# Patient Record
Sex: Female | Born: 1985 | Race: White | Hispanic: No | Marital: Married | Smoking: Former smoker
Health system: Southern US, Community
[De-identification: ages and names within clinical notes are randomized; demographics above are authoritative.]

## PROBLEM LIST (undated history)

## (undated) DIAGNOSIS — R87619 Unspecified abnormal cytological findings in specimens from cervix uteri: Secondary | ICD-10-CM

## (undated) DIAGNOSIS — T7840XA Allergy, unspecified, initial encounter: Secondary | ICD-10-CM

## (undated) DIAGNOSIS — L309 Dermatitis, unspecified: Secondary | ICD-10-CM

## (undated) HISTORY — DX: Allergy, unspecified, initial encounter: T78.40XA

## (undated) HISTORY — PX: TYMPANOSTOMY TUBE PLACEMENT: SHX32

## (undated) HISTORY — PX: MOUTH SURGERY: SHX715

## (undated) HISTORY — DX: Dermatitis, unspecified: L30.9

## (undated) HISTORY — DX: Unspecified abnormal cytological findings in specimens from cervix uteri: R87.619

## (undated) HISTORY — PX: COLPOSCOPY: SHX161

---
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
## 2010-12-24 ENCOUNTER — Other Ambulatory Visit (HOSPITAL_COMMUNITY)
Admission: RE | Admit: 2010-12-24 | Discharge: 2010-12-24 | Disposition: A | Discharge status: Home / Self Care | Payer: BC Managed Care – PPO | Source: Ambulatory Visit | Attending: Obstetrics and Gynecology | Admitting: Obstetrics and Gynecology

## 2010-12-24 DIAGNOSIS — Z124 Encounter for screening for malignant neoplasm of cervix: Secondary | ICD-10-CM | POA: Insufficient documentation

## 2014-01-23 ENCOUNTER — Encounter: Payer: Self-pay | Admitting: Obstetrics & Gynecology

## 2014-01-23 ENCOUNTER — Ambulatory Visit (INDEPENDENT_AMBULATORY_CARE_PROVIDER_SITE_OTHER): Payer: BC Managed Care – PPO | Admitting: Obstetrics & Gynecology

## 2014-01-23 VITALS — BP 122/79 | HR 89 | Resp 16 | Ht 62.0 in | Wt 119.0 lb

## 2014-01-23 DIAGNOSIS — Z1151 Encounter for screening for human papillomavirus (HPV): Secondary | ICD-10-CM

## 2014-01-23 DIAGNOSIS — Z01419 Encounter for gynecological examination (general) (routine) without abnormal findings: Secondary | ICD-10-CM | POA: Insufficient documentation

## 2014-01-23 DIAGNOSIS — Z3002 Counseling and instruction in natural family planning to avoid pregnancy: Secondary | ICD-10-CM | POA: Insufficient documentation

## 2014-01-23 DIAGNOSIS — Z124 Encounter for screening for malignant neoplasm of cervix: Secondary | ICD-10-CM | POA: Diagnosis not present

## 2014-01-23 NOTE — Progress Notes (Signed)
Patient ID: Elizabeth Briggs, female   DOB: [birth year], 29 y.o.   MRN: [id number]  Chief Complaint  Patient presents with  . Gynecologic Exam    HPI Elizabeth FalconGrace E Waldvogel is a 28 y.o. female.  G0P0000 Patient's last menstrual period was 01/13/2014 (exact date). Uses natural family planning, menses usually regular and normal flow. Had abnl pap and Bx afeww years ago and last pap was normal.   HPI  Past Medical History  Diagnosis Date  . Abnormal Pap smear of cervix 2004    History reviewed. No pertinent past surgical history.  Family History  Problem Relation Age of Onset  . Hyperlipidemia Mother   . Diabetes type I Brother   . Prostate cancer Father [id number]    Social History History  Substance Use Topics  . Smoking status: Never Smoker   . Smokeless tobacco: Never Used  . Alcohol Use: 0.0 oz/week    0 Not specified per week     Comment: Occasional    No Known Allergies  No current outpatient prescriptions on file.   No current facility-administered medications for this visit.    Review of Systems Review of Systems  Constitutional: Negative.   Respiratory: Negative.   Gastrointestinal: Negative.   Genitourinary: Negative.     Blood pressure 122/79, pulse 89, resp. rate 16, height 5\' 2"  (1.575 m), weight 53.978 kg (119 lb), last menstrual period 01/13/2014.  Physical Exam Physical Exam  Constitutional: She is oriented to person, place, and time. She appears well-developed. No distress.  Neck: Normal range of motion. Neck supple.  Cardiovascular: Normal rate and regular rhythm.   Pulmonary/Chest: Effort normal. No respiratory distress.  Breasts: breasts appear normal, no suspicious masses, no skin or nipple changes or axillary nodes.   Abdominal: Soft. There is no tenderness.  Genitourinary: Vagina normal and uterus normal. No vaginal discharge found.  Pap done, no mass or tenderness  Neurological: She is alert and oriented to person, place, and time.  Skin: Skin is warm  and dry.  Psychiatric: She has a normal mood and affect. Her behavior is normal.      Assessment    Well woman exam uses natural BCM     Plan    Pap result, RTC PRN. Counseling re. BCM given        ARNOLD,JAMES 01/23/2014, 9:31 AM

## 2014-01-23 NOTE — Patient Instructions (Signed)
Contraception Choices Contraception (birth control) is the use of any methods or devices to prevent pregnancy. Below are some methods to help avoid pregnancy. HORMONAL METHODS   Contraceptive implant. This is a thin, plastic tube containing progesterone hormone. It does not contain estrogen hormone. Your health care provider inserts the tube in the inner part of the upper arm. The tube can remain in place for up to 3 years. After 3 years, the implant must be removed. The implant prevents the ovaries from releasing an egg (ovulation), thickens the cervical mucus to prevent sperm from entering the uterus, and thins the lining of the inside of the uterus.  Progesterone-only injections. These injections are given every 3 months by your health care provider to prevent pregnancy. This synthetic progesterone hormone stops the ovaries from releasing eggs. It also thickens cervical mucus and changes the uterine lining. This makes it harder for sperm to survive in the uterus.  Birth control pills. These pills contain estrogen and progesterone hormone. They work by preventing the ovaries from releasing eggs (ovulation). They also cause the cervical mucus to thicken, preventing the sperm from entering the uterus. Birth control pills are prescribed by a health care provider.Birth control pills can also be used to treat heavy periods.  Minipill. This type of birth control pill contains only the progesterone hormone. They are taken every day of each month and must be prescribed by your health care provider.  Birth control patch. The patch contains hormones similar to those in birth control pills. It must be changed once a week and is prescribed by a health care provider.  Vaginal ring. The ring contains hormones similar to those in birth control pills. It is left in the vagina for 3 weeks, removed for 1 week, and then a new one is put back in place. The patient must be comfortable inserting and removing the ring  from the vagina.A health care provider's prescription is necessary.  Emergency contraception. Emergency contraceptives prevent pregnancy after unprotected sexual intercourse. This pill can be taken right after sex or up to 5 days after unprotected sex. It is most effective the sooner you take the pills after having sexual intercourse. Most emergency contraceptive pills are available without a prescription. Check with your pharmacist. Do not use emergency contraception as your only form of birth control. BARRIER METHODS   Female condom. This is a thin sheath (latex or rubber) that is worn over the penis during sexual intercourse. It can be used with spermicide to increase effectiveness.  Female condom. This is a soft, loose-fitting sheath that is put into the vagina before sexual intercourse.  Diaphragm. This is a soft, latex, dome-shaped barrier that must be fitted by a health care provider. It is inserted into the vagina, along with a spermicidal jelly. It is inserted before intercourse. The diaphragm should be left in the vagina for 6 to 8 hours after intercourse.  Cervical cap. This is a round, soft, latex or plastic cup that fits over the cervix and must be fitted by a health care provider. The cap can be left in place for up to 48 hours after intercourse.  Sponge. This is a soft, circular piece of polyurethane foam. The sponge has spermicide in it. It is inserted into the vagina after wetting it and before sexual intercourse.  Spermicides. These are chemicals that kill or block sperm from entering the cervix and uterus. They come in the form of creams, jellies, suppositories, foam, or tablets. They do not require a   prescription. They are inserted into the vagina with an applicator before having sexual intercourse. The process must be repeated every time you have sexual intercourse. INTRAUTERINE CONTRACEPTION  Intrauterine device (IUD). This is a T-shaped device that is put in a woman's uterus  during a menstrual period to prevent pregnancy. There are 2 types:  Copper IUD. This type of IUD is wrapped in copper wire and is placed inside the uterus. Copper makes the uterus and fallopian tubes produce a fluid that kills sperm. It can stay in place for 10 years.  Hormone IUD. This type of IUD contains the hormone progestin (synthetic progesterone). The hormone thickens the cervical mucus and prevents sperm from entering the uterus, and it also thins the uterine lining to prevent implantation of a fertilized egg. The hormone can weaken or kill the sperm that get into the uterus. It can stay in place for 3-5 years, depending on which type of IUD is used. PERMANENT METHODS OF CONTRACEPTION  Female tubal ligation. This is when the woman's fallopian tubes are surgically sealed, tied, or blocked to prevent the egg from traveling to the uterus.  Hysteroscopic sterilization. This involves placing a small coil or insert into each fallopian tube. Your doctor uses a technique called hysteroscopy to do the procedure. The device causes scar tissue to form. This results in permanent blockage of the fallopian tubes, so the sperm cannot fertilize the egg. It takes about 3 months after the procedure for the tubes to become blocked. You must use another form of birth control for these 3 months.  Female sterilization. This is when the female has the tubes that carry sperm tied off (vasectomy).This blocks sperm from entering the vagina during sexual intercourse. After the procedure, the man can still ejaculate fluid (semen). NATURAL PLANNING METHODS  Natural family planning. This is not having sexual intercourse or using a barrier method (condom, diaphragm, cervical cap) on days the woman could become pregnant.  Calendar method. This is keeping track of the length of each menstrual cycle and identifying when you are fertile.  Ovulation method. This is avoiding sexual intercourse during ovulation.  Symptothermal  method. This is avoiding sexual intercourse during ovulation, using a thermometer and ovulation symptoms.  Post-ovulation method. This is timing sexual intercourse after you have ovulated. Regardless of which type or method of contraception you choose, it is important that you use condoms to protect against the transmission of sexually transmitted infections (STIs). Talk with your health care provider about which form of contraception is most appropriate for you. Document Released: 01/13/2005 Document Revised: 01/18/2013 Document Reviewed: 07/08/2012 Exit[employer] Patient Information 2015 [employer]. This information is not intended to replace advice given to you by your health care provider. Make sure you discuss any questions you have with your health care provider.  

## 2014-01-25 LAB — CYTOLOGY - PAP

## 2014-09-08 ENCOUNTER — Other Ambulatory Visit: Payer: Self-pay | Admitting: Advanced Practice Midwife

## 2014-09-08 ENCOUNTER — Ambulatory Visit (INDEPENDENT_AMBULATORY_CARE_PROVIDER_SITE_OTHER): Payer: BLUE CROSS/BLUE SHIELD | Admitting: Advanced Practice Midwife

## 2014-09-08 ENCOUNTER — Encounter: Payer: Self-pay | Admitting: Advanced Practice Midwife

## 2014-09-08 VITALS — Wt 124.0 lb

## 2014-09-08 DIAGNOSIS — Z36 Encounter for antenatal screening of mother: Secondary | ICD-10-CM | POA: Diagnosis not present

## 2014-09-08 DIAGNOSIS — Z3491 Encounter for supervision of normal pregnancy, unspecified, first trimester: Secondary | ICD-10-CM

## 2014-09-08 DIAGNOSIS — Z3401 Encounter for supervision of normal first pregnancy, first trimester: Secondary | ICD-10-CM | POA: Diagnosis not present

## 2014-09-08 DIAGNOSIS — Z3490 Encounter for supervision of normal pregnancy, unspecified, unspecified trimester: Secondary | ICD-10-CM | POA: Insufficient documentation

## 2014-09-08 NOTE — Progress Notes (Signed)
   Subjective:    Elizabeth Briggs is a G1P0000 [redacted]w[redacted]d being seen today for her first obstetrical visit.  Her obstetrical history is significant for nothing, Low Risk.. Patient does intend to breast feed. Pregnancy history fully reviewed.  Patient reports no complaints.  Lives near [place] but works in [place].  Filed Vitals:   09/08/14 1039  Weight: 124 lb (56.246 kg)    HISTORY: OB History  Gravida Para Term Preterm AB SAB TAB Ectopic Multiple Living     # Outcome Date GA Lbr Len/2nd Weight Sex Delivery Anes PTL Lv  1 Current              Past Medical History  Diagnosis Date  . Abnormal Pap smear of cervix 2004   Past Surgical History  Procedure Laterality Date  . Mouth surgery    . Colposcopy     Family History  Problem Relation Age of Onset  . Hyperlipidemia Mother   . Diabetes type I Brother   . Prostate cancer Father 17  . Endometriosis Mother     Medical, Surgical, Family and Social histories reviewed and are listed above.  Medications and allergies reviewed.   Exam    Uterus:  Fundal Height: 7 cm  Pelvic Exam:    Perineum: No Hemorrhoids, Normal Perineum   Vulva: Bartholin's, Urethra, Skene's normal   Vagina:  normal mucosa, normal discharge   pH:    Cervix: nulliparous appearance   Adnexa: no mass, fullness, tenderness   Bony Pelvis: gynecoid  System: Breast:  normal appearance, no masses or tenderness   Skin: normal coloration and turgor, no rashes    Neurologic: oriented, grossly non-focal   Extremities: normal strength, tone, and muscle mass   HEENT neck supple with midline trachea   Mouth/Teeth mucous membranes moist, pharynx normal without lesions   Neck supple and no masses   Cardiovascular: regular rate and rhythm   Respiratory:  appears well, vitals normal, no respiratory distress, acyanotic, normal RR, ear and throat exam is normal, neck free of mass or lymphadenopathy, chest clear, no wheezing, crepitations, rhonchi,  normal symmetric air entry   Abdomen: soft, non-tender; bowel sounds normal; no masses,  no organomegaly   Urinary: urethral meatus normal      Assessment:    Pregnancy: G1P0000 Patient Active Problem List   Diagnosis Date Noted  . Supervision of normal pregnancy 09/08/2014  . Well woman exam with routine gynecological exam 01/23/2014  . Family planning, natural methods to avoid pregnancy 01/23/2014        Plan:     Initial labs drawn. Prenatal vitamins. Problem list reviewed and updated. Genetic Screening discussed First Screen: declined.  Ultrasound discussed; fetal survey: requested.  Follow up in 5 weeks per [employer] 50% of 30 min visit spent on counseling and coordination of care.   Enrolled in [employer] program. RN informed her of program specifics   [facility] 09/08/2014

## 2014-09-08 NOTE — Progress Notes (Signed)
Pt here for initial prenatal visit - 12/15 normal pap Bedside US shows single IUP [redacted]w[redacted]d 16fhr

## 2014-09-08 NOTE — Patient Instructions (Signed)
First Trimester of Pregnancy The first trimester of pregnancy is from week 1 until the end of week 12 (months 1 through 3). A week after a sperm fertilizes an egg, the egg will implant on the wall of the uterus. This embryo will begin to develop into a baby. Genes from you and your partner are forming the baby. The female genes determine whether the baby is a boy or a girl. At 6-8 weeks, the eyes and face are formed, and the heartbeat can be seen on ultrasound. At the end of 12 weeks, all the baby's organs are formed.  Now that you are pregnant, you will want to do everything you can to have a healthy baby. Two of the most important things are to get good prenatal care and to follow your health care provider's instructions. Prenatal care is all the medical care you receive before the baby's birth. This care will help prevent, find, and treat any problems during the pregnancy and childbirth. BODY CHANGES Your body goes through many changes during pregnancy. The changes vary from woman to woman.   You may gain or lose a couple of pounds at first.  You may feel sick to your stomach (nauseous) and throw up (vomit). If the vomiting is uncontrollable, call your health care provider.  You may tire easily.  You may develop headaches that can be relieved by medicines approved by your health care provider.  You may urinate more often. Painful urination may mean you have a bladder infection.  You may develop heartburn as a result of your pregnancy.  You may develop constipation because certain hormones are causing the muscles that push waste through your intestines to slow down.  You may develop hemorrhoids or swollen, bulging veins (varicose veins).  Your breasts may begin to grow larger and become tender. Your nipples may stick out more, and the tissue that surrounds them (areola) may become darker.  Your gums may bleed and may be sensitive to brushing and flossing.  Dark spots or blotches (chloasma,  mask of pregnancy) may develop on your face. This will likely fade after the baby is born.  Your menstrual periods will stop.  You may have a loss of appetite.  You may develop cravings for certain kinds of food.  You may have changes in your emotions from day to day, such as being excited to be pregnant or being concerned that something may go wrong with the pregnancy and baby.  You may have more vivid and strange dreams.  You may have changes in your hair. These can include thickening of your hair, rapid growth, and changes in texture. Some women also have hair loss during or after pregnancy, or hair that feels dry or thin. Your hair will most likely return to normal after your baby is born. WHAT TO EXPECT AT YOUR PRENATAL VISITS During a routine prenatal visit:  You will be weighed to make sure you and the baby are growing normally.  Your blood pressure will be taken.  Your abdomen will be measured to track your baby's growth.  The fetal heartbeat will be listened to starting around week 10 or 12 of your pregnancy.  Test results from any previous visits will be discussed. Your health care provider may ask you:  How you are feeling.  If you are feeling the baby move.  If you have had any abnormal symptoms, such as leaking fluid, bleeding, severe headaches, or abdominal cramping.  If you have any questions. Other tests   that may be performed during your first trimester include:  Blood tests to find your blood type and to check for the presence of any previous infections. They will also be used to check for low iron levels (anemia) and Rh antibodies. Later in the pregnancy, blood tests for diabetes will be done along with other tests if problems develop.  Urine tests to check for infections, diabetes, or protein in the urine.  An ultrasound to confirm the proper growth and development of the baby.  An amniocentesis to check for possible genetic problems.  Fetal screens for  spina bifida and Down syndrome.  You may need other tests to make sure you and the baby are doing well. HOME CARE INSTRUCTIONS  Medicines  Follow your health care provider's instructions regarding medicine use. Specific medicines may be either safe or unsafe to take during pregnancy.  Take your prenatal vitamins as directed.  If you develop constipation, try taking a stool softener if your health care provider approves. Diet  Eat regular, well-balanced meals. Choose a variety of foods, such as meat or vegetable-based protein, fish, milk and low-fat dairy products, vegetables, fruits, and whole grain breads and cereals. Your health care provider will help you determine the amount of weight gain that is right for you.  Avoid raw meat and uncooked cheese. These carry germs that can cause birth defects in the baby.  Eating four or five small meals rather than three large meals a day may help relieve nausea and vomiting. If you start to feel nauseous, eating a few soda crackers can be helpful. Drinking liquids between meals instead of during meals also seems to help nausea and vomiting.  If you develop constipation, eat more high-fiber foods, such as fresh vegetables or fruit and whole grains. Drink enough fluids to keep your urine clear or pale yellow. Activity and Exercise  Exercise only as directed by your health care provider. Exercising will help you:  Control your weight.  Stay in shape.  Be prepared for labor and delivery.  Experiencing pain or cramping in the lower abdomen or low back is a good sign that you should stop exercising. Check with your health care provider before continuing normal exercises.  Try to avoid standing for long periods of time. Move your legs often if you must stand in one place for a long time.  Avoid heavy lifting.  Wear low-heeled shoes, and practice good posture.  You may continue to have sex unless your health care provider directs you  otherwise. Relief of Pain or Discomfort  Wear a good support bra for breast tenderness.   Take warm sitz baths to soothe any pain or discomfort caused by hemorrhoids. Use hemorrhoid cream if your health care provider approves.   Rest with your legs elevated if you have leg cramps or low back pain.  If you develop varicose veins in your legs, wear support hose. Elevate your feet for 15 minutes, 3-4 times a day. Limit salt in your diet. Prenatal Care  Schedule your prenatal visits by the twelfth week of pregnancy. They are usually scheduled monthly at first, then more often in the last 2 months before delivery.  Write down your questions. Take them to your prenatal visits.  Keep all your prenatal visits as directed by your health care provider. Safety  Wear your seat belt at all times when driving.  Make a list of emergency phone numbers, including numbers for family, friends, the hospital, and police and fire departments. General Tips    Ask your health care provider for a referral to a local prenatal education class. Begin classes no later than at the beginning of month 6 of your pregnancy.  Ask for help if you have counseling or nutritional needs during pregnancy. Your health care provider can offer advice or refer you to specialists for help with various needs.  Do not use hot tubs, steam rooms, or saunas.  Do not douche or use tampons or scented sanitary pads.  Do not cross your legs for long periods of time.  Avoid cat litter boxes and soil used by cats. These carry germs that can cause birth defects in the baby and possibly loss of the fetus by miscarriage or stillbirth.  Avoid all smoking, herbs, alcohol, and medicines not prescribed by your health care provider. Chemicals in these affect the formation and growth of the baby.  Schedule a dentist appointment. At home, brush your teeth with a soft toothbrush and be gentle when you floss. SEEK MEDICAL CARE IF:   You have  dizziness.  You have mild pelvic cramps, pelvic pressure, or nagging pain in the abdominal area.  You have persistent nausea, vomiting, or diarrhea.  You have a bad smelling vaginal discharge.  You have pain with urination.  You notice increased swelling in your face, hands, legs, or ankles. SEEK IMMEDIATE MEDICAL CARE IF:   You have a fever.  You are leaking fluid from your vagina.  You have spotting or bleeding from your vagina.  You have severe abdominal cramping or pain.  You have rapid weight gain or loss.  You vomit blood or material that looks like coffee grounds.  You are exposed to German measles and have never had them.  You are exposed to fifth disease or chickenpox.  You develop a severe headache.  You have shortness of breath.  You have any kind of trauma, such as from a fall or a car accident. Document Released: 01/07/2001 Document Revised: 05/30/2013 Document Reviewed: 11/23/2012 Exit[employer] Patient Information 2015 [employer]. This information is not intended to replace advice given to you by your health care provider. Make sure you discuss any questions you have with your health care provider.  

## 2014-09-09 LAB — GC/CHLAMYDIA PROBE AMP
CT PROBE, AMP APTIMA: NEGATIVE
GC PROBE AMP APTIMA: NEGATIVE

## 2014-09-11 LAB — PRENATAL PROFILE (SOLSTAS)
Antibody Screen: NEGATIVE
BASOS ABS: 0.1 10*3/uL (ref 0.0–0.1)
Basophils Relative: 1 % (ref 0–1)
EOS ABS: 0.1 10*3/uL (ref 0.0–0.7)
EOS PCT: 1 % (ref 0–5)
HCT: 38.5 % (ref 36.0–46.0)
HEP B S AG: NEGATIVE
HIV 1&2 Ab, 4th Generation: NONREACTIVE
Hemoglobin: 12.9 g/dL (ref 12.0–15.0)
Lymphocytes Relative: 23 % (ref 12–46)
Lymphs Abs: 1.4 10*3/uL (ref 0.7–4.0)
MCH: 29.3 pg (ref 26.0–34.0)
MCHC: 33.5 g/dL (ref 30.0–36.0)
MCV: 87.5 fL (ref 78.0–100.0)
MPV: 9.6 fL (ref 8.6–12.4)
Monocytes Absolute: 0.5 10*3/uL (ref 0.1–1.0)
Monocytes Relative: 8 % (ref 3–12)
NEUTROS PCT: 67 % (ref 43–77)
Neutro Abs: 4.2 10*3/uL (ref 1.7–7.7)
PLATELETS: 234 10*3/uL (ref 150–400)
RBC: 4.4 MIL/uL (ref 3.87–5.11)
RDW: 13.7 % (ref 11.5–15.5)
Rh Type: POSITIVE
Rubella: 0.67 Index (ref <0.90)
WBC: 6.3 10*3/uL (ref 4.0–10.5)

## 2014-09-12 ENCOUNTER — Telehealth: Payer: Self-pay | Admitting: *Deleted

## 2014-09-12 DIAGNOSIS — N390 Urinary tract infection, site not specified: Secondary | ICD-10-CM

## 2014-09-12 LAB — CULTURE, OB URINE: Colony Count: >=100000

## 2014-09-12 MED ORDER — CEPHALEXIN 500 MG PO CAPS: 500.0000 mg | ORAL_CAPSULE | Freq: Four times a day (QID) | ORAL | Status: DC

## 2014-09-12 NOTE — Telephone Encounter (Signed)
Pt notified of urine culture and needs ABX.  Per Dr Penne Lash Keflex 500 mg QID x 7 days sent to [facility] in [place].

## 2014-09-14 ENCOUNTER — Telehealth: Payer: Self-pay | Admitting: *Deleted

## 2014-09-14 DIAGNOSIS — Z3491 Encounter for supervision of normal pregnancy, unspecified, first trimester: Secondary | ICD-10-CM

## 2014-09-14 NOTE — Telephone Encounter (Signed)
Updated problem list

## 2014-10-09 ENCOUNTER — Ambulatory Visit (INDEPENDENT_AMBULATORY_CARE_PROVIDER_SITE_OTHER): Payer: BLUE CROSS/BLUE SHIELD | Admitting: Advanced Practice Midwife

## 2014-10-09 VITALS — BP 113/76 | HR 99 | Wt 124.0 lb

## 2014-10-09 DIAGNOSIS — Z3481 Encounter for supervision of other normal pregnancy, first trimester: Secondary | ICD-10-CM

## 2014-10-09 DIAGNOSIS — Z3491 Encounter for supervision of normal pregnancy, unspecified, first trimester: Secondary | ICD-10-CM

## 2014-10-09 NOTE — Addendum Note (Signed)
Addended by: Sharen Counter A on: 10/09/2014 09:01 AM   Modules accepted: Orders

## 2014-10-09 NOTE — Patient Instructions (Signed)
Second Trimester of Pregnancy The second trimester is from week 13 through week 28, months 4 through 6. The second trimester is often a time when you feel your best. Your body has also adjusted to being pregnant, and you begin to feel better physically. Usually, morning sickness has lessened or quit completely, you may have more energy, and you may have an increase in appetite. The second trimester is also a time when the fetus is growing rapidly. At the end of the sixth month, the fetus is about 9 inches long and weighs about 1 pounds. You will likely begin to feel the baby move (quickening) between 18 and 20 weeks of the pregnancy. BODY CHANGES Your body goes through many changes during pregnancy. The changes vary from woman to woman.   Your weight will continue to increase. You will notice your lower abdomen bulging out.  You may begin to get stretch marks on your hips, abdomen, and breasts.  You may develop headaches that can be relieved by medicines approved by your health care provider.  You may urinate more often because the fetus is pressing on your bladder.  You may develop or continue to have heartburn as a result of your pregnancy.  You may develop constipation because certain hormones are causing the muscles that push waste through your intestines to slow down.  You may develop hemorrhoids or swollen, bulging veins (varicose veins).  You may have back pain because of the weight gain and pregnancy hormones relaxing your joints between the bones in your pelvis and as a result of a shift in weight and the muscles that support your balance.  Your breasts will continue to grow and be tender.  Your gums may bleed and may be sensitive to brushing and flossing.  Dark spots or blotches (chloasma, mask of pregnancy) may develop on your face. This will likely fade after the baby is born.  A dark line from your belly button to the pubic area (linea nigra) may appear. This will likely fade  after the baby is born.  You may have changes in your hair. These can include thickening of your hair, rapid growth, and changes in texture. Some women also have hair loss during or after pregnancy, or hair that feels dry or thin. Your hair will most likely return to normal after your baby is born. WHAT TO EXPECT AT YOUR PRENATAL VISITS During a routine prenatal visit:  You will be weighed to make sure you and the fetus are growing normally.  Your blood pressure will be taken.  Your abdomen will be measured to track your baby's growth.  The fetal heartbeat will be listened to.  Any test results from the previous visit will be discussed. Your health care provider may ask you:  How you are feeling.  If you are feeling the baby move.  If you have had any abnormal symptoms, such as leaking fluid, bleeding, severe headaches, or abdominal cramping.  If you have any questions. Other tests that may be performed during your second trimester include:  Blood tests that check for:  Low iron levels (anemia).  Gestational diabetes (between 24 and 28 weeks).  Rh antibodies.  Urine tests to check for infections, diabetes, or protein in the urine.  An ultrasound to confirm the proper growth and development of the baby.  An amniocentesis to check for possible genetic problems.  Fetal screens for spina bifida and Down syndrome. HOME CARE INSTRUCTIONS   Avoid all smoking, herbs, alcohol, and unprescribed   drugs. These chemicals affect the formation and growth of the baby.  Follow your health care provider's instructions regarding medicine use. There are medicines that are either safe or unsafe to take during pregnancy.  Exercise only as directed by your health care provider. Experiencing uterine cramps is a good sign to stop exercising.  Continue to eat regular, healthy meals.  Wear a good support bra for breast tenderness.  Do not use hot tubs, steam rooms, or saunas.  Wear your  seat belt at all times when driving.  Avoid raw meat, uncooked cheese, cat litter boxes, and soil used by cats. These carry germs that can cause birth defects in the baby.  Take your prenatal vitamins.  Try taking a stool softener (if your health care provider approves) if you develop constipation. Eat more high-fiber foods, such as fresh vegetables or fruit and whole grains. Drink plenty of fluids to keep your urine clear or pale yellow.  Take warm sitz baths to soothe any pain or discomfort caused by hemorrhoids. Use hemorrhoid cream if your health care provider approves.  If you develop varicose veins, wear support hose. Elevate your feet for 15 minutes, 3-4 times a day. Limit salt in your diet.  Avoid heavy lifting, wear low heel shoes, and practice good posture.  Rest with your legs elevated if you have leg cramps or low back pain.  Visit your dentist if you have not gone yet during your pregnancy. Use a soft toothbrush to brush your teeth and be gentle when you floss.  A sexual relationship may be continued unless your health care provider directs you otherwise.  Continue to go to all your prenatal visits as directed by your health care provider. SEEK MEDICAL CARE IF:   You have dizziness.  You have mild pelvic cramps, pelvic pressure, or nagging pain in the abdominal area.  You have persistent nausea, vomiting, or diarrhea.  You have a bad smelling vaginal discharge.  You have pain with urination. SEEK IMMEDIATE MEDICAL CARE IF:   You have a fever.  You are leaking fluid from your vagina.  You have spotting or bleeding from your vagina.  You have severe abdominal cramping or pain.  You have rapid weight gain or loss.  You have shortness of breath with chest pain.  You notice sudden or extreme swelling of your face, hands, ankles, feet, or legs.  You have not felt your baby move in over an hour.  You have severe headaches that do not go away with  medicine.  You have vision changes. Document Released: 01/07/2001 Document Revised: 01/18/2013 Document Reviewed: 03/16/2012 [employer] Patient Information 2015 [employer]. This information is not intended to replace advice given to you by your health care provider. Make sure you discuss any questions you have with your health care provider.  

## 2014-10-09 NOTE — Progress Notes (Signed)
Pt taking 1/2 Unisom and B6 for nausea

## 2014-10-09 NOTE — Progress Notes (Signed)
Subjective:  Elizabeth Briggs is a 29 y.o. G1P0000 at [redacted]w[redacted]d being seen today for ongoing prenatal care.  Patient reports fatigue and nausea.  Well controlled with Unisom/B6.  Contractions: Not present.  Vag. Bleeding: None. Movement: Absent. Denies leaking of fluid.   The following portions of the patient's history were reviewed and updated as appropriate: allergies, current medications, past family history, past medical history, past social history, past surgical history and problem list.   Objective:   Filed Vitals:   10/09/14 0828  BP: 113/76  Pulse: 99  Weight: 124 lb (56.246 kg)    Fetal Status: Fetal Heart Rate (bpm): 162   Movement: Absent     General:  Alert, oriented and cooperative. Patient is in no acute distress.  Skin: Skin is warm and dry. No rash noted.   Cardiovascular: Normal heart rate noted  Respiratory: Normal respiratory effort, no problems with respiration noted  Abdomen: Soft, gravid, appropriate for gestational age. Pain/Pressure: Absent     Pelvic: Vag. Bleeding: None Vag D/C Character: Thin   Cervical exam deferred        Extremities: Normal range of motion.  Edema: None  Mental Status: Normal mood and affect. Normal behavior. Normal judgment and thought content.   Urinalysis: Urine Protein: Negative Urine Glucose: Negative  Assessment and Plan:  Pregnancy: G1P0000 at [redacted]w[redacted]d  There are no diagnoses linked to this encounter. Miscarriage symptoms and general obstetric precautions including but not limited to vaginal bleeding and cramping were reviewed in detail with the patient. Please refer to After Visit Summary for other counseling recommendations.   F/U per Babyscripts schedule   Hurshel Party, CNM

## 2014-10-16 ENCOUNTER — Encounter: Payer: Self-pay | Admitting: *Deleted

## 2014-11-20 ENCOUNTER — Other Ambulatory Visit: Payer: Self-pay | Admitting: Advanced Practice Midwife

## 2014-11-20 ENCOUNTER — Ambulatory Visit (HOSPITAL_COMMUNITY)
Admission: RE | Admit: 2014-11-20 | Discharge: 2014-11-20 | Disposition: A | Discharge status: Home / Self Care | Payer: BLUE CROSS/BLUE SHIELD | Source: Ambulatory Visit | Attending: Advanced Practice Midwife | Admitting: Advanced Practice Midwife

## 2014-11-20 DIAGNOSIS — Z3491 Encounter for supervision of normal pregnancy, unspecified, first trimester: Secondary | ICD-10-CM

## 2014-12-08 ENCOUNTER — Encounter: Payer: BLUE CROSS/BLUE SHIELD | Admitting: Certified Nurse Midwife

## 2014-12-13 ENCOUNTER — Ambulatory Visit (INDEPENDENT_AMBULATORY_CARE_PROVIDER_SITE_OTHER): Payer: BLUE CROSS/BLUE SHIELD | Admitting: Obstetrics & Gynecology

## 2014-12-13 VITALS — BP 111/67 | HR 96 | Wt 136.0 lb

## 2014-12-13 DIAGNOSIS — O09892 Supervision of other high risk pregnancies, second trimester: Secondary | ICD-10-CM

## 2014-12-13 DIAGNOSIS — Z23 Encounter for immunization: Secondary | ICD-10-CM | POA: Diagnosis not present

## 2014-12-13 DIAGNOSIS — O4412 Placenta previa with hemorrhage, second trimester: Secondary | ICD-10-CM

## 2014-12-13 DIAGNOSIS — O4403 Placenta previa specified as without hemorrhage, third trimester: Secondary | ICD-10-CM

## 2014-12-13 DIAGNOSIS — Z3492 Encounter for supervision of normal pregnancy, unspecified, second trimester: Secondary | ICD-10-CM

## 2014-12-13 NOTE — Progress Notes (Signed)
Subjective:  Elizabeth Briggs is a 29 y.o. G1P0000 at 8442w2d being seen today for ongoing prenatal care.  Patient reports occasional contractions and mild lower back pain, reflux (mild), pain behind knee yesterday.  Contractions: Not present.  Vag. Bleeding: None. Movement: Present. Denies leaking of fluid.   The following portions of the patient's history were reviewed and updated as appropriate: allergies, current medications, past family history, past medical history, past social history, past surgical history and problem list. Problem list updated.  Objective:   Filed Vitals:   12/13/14 1002  BP: 111/67  Pulse: 96  Weight: 136 lb (61.689 kg)    Fetal Status: Fetal Heart Rate (bpm): 157 Fundal Height: 22 cm Movement: Present     General:  Alert, oriented and cooperative. Patient is in no acute distress.  Skin: Skin is warm and dry. No rash noted.   Cardiovascular: Normal heart rate noted  Respiratory: Normal respiratory effort, no problems with respiration noted  Abdomen: Soft, gravid, appropriate for gestational age. Pain/Pressure: Absent     Pelvic: Vag. Bleeding: None Vag D/C Character: Thin   Cervical exam performed        Extremities: Normal range of motion.  Edema: Trace; no swelling, redness, pain, neg Homans  Mental Status: Normal mood and affect. Normal behavior. Normal judgment and thought content.   Urinalysis: Urine Protein: Negative Urine Glucose: Negative  Assessment and Plan:  Pregnancy: G1P0000 at 8242w2d  1. Immunization due - Flu Vaccine QUAD 36+ mos IM (Fluarix, Quad PF)  2. Supervision of normal pregnancy, second trimester -pregnancy belt, yoga, stretches -incrase Zantac to 150 mg bid  3. Placenta previa, third trimester - f/u anatomy; VAG REST - US MFM OB FOLLOW UP; Future  Preterm labor symptoms and general obstetric precautions including but not limited to vaginal bleeding, contractions, leaking of fluid and fetal movement were reviewed in detail with the  patient. Please refer to After Visit Summary for other counseling recommendations.  Return in about 7 weeks (around 01/31/2015).   Lesly DukesKelly H Payeton Germani, MD

## 2014-12-13 NOTE — Progress Notes (Signed)
BABYSCRIPTS PATIENT: [ x] initial, [x ] 6312, [x ] 20, [ ]  28, [ ]  32, [ ]  36, [ ]  38, [ ]  39, [ ]  40 Constipation issues

## 2015-01-05 ENCOUNTER — Ambulatory Visit (HOSPITAL_COMMUNITY)
Admission: RE | Admit: 2015-01-05 | Discharge: 2015-01-05 | Disposition: A | Discharge status: Home / Self Care | Payer: BLUE CROSS/BLUE SHIELD | Source: Ambulatory Visit | Attending: Obstetrics & Gynecology | Admitting: Obstetrics & Gynecology

## 2015-01-05 DIAGNOSIS — Z3492 Encounter for supervision of normal pregnancy, unspecified, second trimester: Secondary | ICD-10-CM

## 2015-01-05 DIAGNOSIS — Z3A24 24 weeks gestation of pregnancy: Secondary | ICD-10-CM | POA: Insufficient documentation

## 2015-01-05 DIAGNOSIS — Z36 Encounter for antenatal screening of mother: Secondary | ICD-10-CM | POA: Insufficient documentation

## 2015-01-05 DIAGNOSIS — O4403 Placenta previa specified as without hemorrhage, third trimester: Secondary | ICD-10-CM

## 2015-01-28 NOTE — L&D Delivery Note (Signed)
Patient is 30 y.o. G1P0000 5353w0d admitted IOL for post dates   Delivery Note At 8:36 PM a viable female was delivered via Vaginal, Spontaneous Delivery (Presentation: Left Occiput Anterior).  APGAR: 6, 9; weight pending.  Head was delivered by myself.  Nuchal cord x1 appreciated.  This was easily reduced.  Shoulders were tight.  Elizabeth Briggs, CNM, immediately took over delivery and delivered the shoulders.  Infant dried and placed on mother's abdomen.  Upon delivery, baby was slow to rouse.  After about 10-15 seconds of stimulation, Cord clamped and was quickly cut by FOB, and infant was transferred to the warmer, where resuscitative measures were continued.  Code APGAR called at this time as well.  NICU attending quickly arrived and took over care of infant, see their note for details.  Cord arterial and venous blood gas collected.  Hospital cord blood sample collected.  Placenta delivered after about 28 minutes, fundal massage applied, and vagina inspected for lacerations.  Bilateral shallow labial lacs appreciated.  These were repaired with a single stitch each.  Placenta status: Intact, Spontaneous.  Cord: 3VC with the following complications: nuchal x1.  Cord pH: pending  Anesthesia: Epidural  Episiotomy: None Lacerations: Labial Suture Repair: 3.0 vicryl rapide Est. Blood Loss (mL): 100cc   Mom to postpartum.  Baby to Couplet care / Skin to Skin.  Elizabeth FlavinAshly Mahonri Seiden, DO 04/30/2015, 9:05 PM

## 2015-01-31 ENCOUNTER — Ambulatory Visit (INDEPENDENT_AMBULATORY_CARE_PROVIDER_SITE_OTHER): Payer: BLUE CROSS/BLUE SHIELD | Admitting: Obstetrics & Gynecology

## 2015-01-31 VITALS — BP 105/72 | HR 101 | Wt 146.0 lb

## 2015-01-31 DIAGNOSIS — Z3490 Encounter for supervision of normal pregnancy, unspecified, unspecified trimester: Secondary | ICD-10-CM

## 2015-01-31 DIAGNOSIS — Z36 Encounter for antenatal screening of mother: Secondary | ICD-10-CM

## 2015-01-31 DIAGNOSIS — Z3403 Encounter for supervision of normal first pregnancy, third trimester: Secondary | ICD-10-CM

## 2015-01-31 DIAGNOSIS — Z23 Encounter for immunization: Secondary | ICD-10-CM

## 2015-01-31 DIAGNOSIS — Z3492 Encounter for supervision of normal pregnancy, unspecified, second trimester: Secondary | ICD-10-CM

## 2015-01-31 LAB — CBC
HEMATOCRIT: 33.6 % — AB (ref 36.0–46.0)
HEMOGLOBIN: 11.6 g/dL — AB (ref 12.0–15.0)
MCH: 29.7 pg (ref 26.0–34.0)
MCHC: 34.5 g/dL (ref 30.0–36.0)
MCV: 86.2 fL (ref 78.0–100.0)
MPV: 10.2 fL (ref 8.6–12.4)
Platelets: 199 10*3/uL (ref 150–400)
RBC: 3.9 MIL/uL (ref 3.87–5.11)
RDW: 13.5 % (ref 11.5–15.5)
WBC: 5.7 10*3/uL (ref 4.0–10.5)

## 2015-01-31 MED ORDER — BREAST PUMP MISC: Status: DC

## 2015-01-31 NOTE — Progress Notes (Signed)
28wk labs today Urine dip shows trace glucose but pt had already had her glucola for 28wk labs before providing urine specimen.

## 2015-01-31 NOTE — Progress Notes (Signed)
Subjective:  Elizabeth Briggs is a 30 y.o. G1P0000 at 3338w2d being seen today for ongoing prenatal care.  She is currently monitored for the following issues for this low-risk pregnancy and has Well woman exam with routine gynecological exam; Family planning, natural methods to avoid pregnancy; and Supervision of normal pregnancy on her problem list.  Patient reports no complaints.  Contractions: Not present. Vag. Bleeding: None.  Movement: Present. Denies leaking of fluid.   The following portions of the patient's history were reviewed and updated as appropriate: allergies, current medications, past family history, past medical history, past social history, past surgical history and problem list. Problem list updated.  Objective:   Filed Vitals:   01/31/15 0956  BP: 105/72  Pulse: 101  Weight: 146 lb (66.225 kg)    Fetal Status: Fetal Heart Rate (bpm): 150 Fundal Height: 29 cm Movement: Present     General:  Alert, oriented and cooperative. Patient is in no acute distress.  Skin: Skin is warm and dry. No rash noted.   Cardiovascular: Normal heart rate noted  Respiratory: Normal respiratory effort, no problems with respiration noted  Abdomen: Soft, gravid, appropriate for gestational age. Pain/Pressure: Absent     Pelvic: Vag. Bleeding: None Vag D/C Character: Thin   Cervical exam deferred        Extremities: Normal range of motion.  Edema: Trace  Mental Status: Normal mood and affect. Normal behavior. Normal judgment and thought content.   Urinalysis: Urine Protein: Negative Urine Glucose: Trace  Assessment and Plan:  Pregnancy: G1P0000 at 4938w2d  1. Normal pregnancy, antepartum - Glucose Tolerance, 1 HR (50g) - HIV antibody - RPR - CBC - Tdap vaccine greater than or equal to 7yo IM  Preterm labor symptoms and general obstetric precautions including but not limited to vaginal bleeding, contractions, leaking of fluid and fetal movement were reviewed in detail with the  patient. Please refer to After Visit Summary for other counseling recommendations.  Return in about 4 weeks (around 02/28/2015).   Lesly DukesKelly H Leggett, MD

## 2015-02-01 LAB — HIV ANTIBODY (ROUTINE TESTING W REFLEX): HIV 1&2 Ab, 4th Generation: NONREACTIVE

## 2015-02-01 LAB — GLUCOSE TOLERANCE, 1 HOUR (50G) W/O FASTING: Glucose, 1 Hour GTT: 95 mg/dL (ref 70–140)

## 2015-02-03 LAB — RPR

## 2015-02-28 ENCOUNTER — Ambulatory Visit (INDEPENDENT_AMBULATORY_CARE_PROVIDER_SITE_OTHER): Payer: BLUE CROSS/BLUE SHIELD | Admitting: Obstetrics & Gynecology

## 2015-02-28 VITALS — BP 106/65 | HR 83 | Wt 150.0 lb

## 2015-02-28 DIAGNOSIS — Z3483 Encounter for supervision of other normal pregnancy, third trimester: Secondary | ICD-10-CM

## 2015-02-28 DIAGNOSIS — Z3493 Encounter for supervision of normal pregnancy, unspecified, third trimester: Secondary | ICD-10-CM

## 2015-02-28 NOTE — Progress Notes (Signed)
Subjective:  Elizabeth Briggs is a 30 y.o. G1P0000 at [redacted]w[redacted]d being seen today for ongoing prenatal care.  She is currently monitored for the following issues for this low-risk pregnancy and has Supervision of normal pregnancy on her problem list.  Patient reports no complaints.  Contractions: Not present. Vag. Bleeding: None.  Movement: Present. Denies leaking of fluid.   The following portions of the patient's history were reviewed and updated as appropriate: allergies, current medications, past family history, past medical history, past social history, past surgical history and problem list. Problem list updated.  Objective:   Filed Vitals:   02/28/15 1031  BP: 106/65  Pulse: 83  Weight: 150 lb (68.04 kg)    Fetal Status: Fetal Heart Rate (bpm): 158   Movement: Present     General:  Alert, oriented and cooperative. Patient is in no acute distress.  Skin: Skin is warm and dry. No rash noted.   Cardiovascular: Normal heart rate noted  Respiratory: Normal respiratory effort, no problems with respiration noted  Abdomen: Soft, gravid, appropriate for gestational age. Pain/Pressure: Absent     Pelvic: Vag. Bleeding: None Vag D/C Character: Thin   Cervical exam deferred        Extremities: Normal range of motion.  Edema: Trace  Mental Status: Normal mood and affect. Normal behavior. Normal judgment and thought content.   Urinalysis: Urine Protein: Negative Urine Glucose: Negative  Assessment and Plan:  Pregnancy: G1P0000 at [redacted]w[redacted]d  1. Supervision of normal pregnancy, third trimester babyscripts compliant No concerns  Preterm labor symptoms and general obstetric precautions including but not limited to vaginal bleeding, contractions, leaking of fluid and fetal movement were reviewed in detail with the patient. Please refer to After Visit Summary for other counseling recommendations.  Return in about 4 weeks (around 03/28/2015).   Lesly Dukes, MD

## 2015-02-28 NOTE — Progress Notes (Signed)
Baby scripts compliant 

## 2015-03-28 ENCOUNTER — Ambulatory Visit (INDEPENDENT_AMBULATORY_CARE_PROVIDER_SITE_OTHER): Payer: BLUE CROSS/BLUE SHIELD | Admitting: Obstetrics & Gynecology

## 2015-03-28 ENCOUNTER — Other Ambulatory Visit (HOSPITAL_COMMUNITY): Payer: Self-pay | Admitting: Obstetrics & Gynecology

## 2015-03-28 VITALS — BP 123/86 | HR 97 | Wt 158.0 lb

## 2015-03-28 DIAGNOSIS — Z36 Encounter for antenatal screening of mother: Secondary | ICD-10-CM

## 2015-03-28 DIAGNOSIS — Z3403 Encounter for supervision of normal first pregnancy, third trimester: Secondary | ICD-10-CM

## 2015-03-28 DIAGNOSIS — Z3493 Encounter for supervision of normal pregnancy, unspecified, third trimester: Secondary | ICD-10-CM

## 2015-03-28 LAB — OB RESULTS CONSOLE GBS: STREP GROUP B AG: POSITIVE

## 2015-03-28 LAB — OB RESULTS CONSOLE GC/CHLAMYDIA
Chlamydia: NEGATIVE
GC PROBE AMP, GENITAL: NEGATIVE

## 2015-03-28 MED ORDER — OMEPRAZOLE 20 MG PO CPDR: 20.0000 mg | DELAYED_RELEASE_CAPSULE | Freq: Every day | ORAL | Status: DC

## 2015-03-28 NOTE — Progress Notes (Signed)
Subjective:  Elizabeth Briggs is a 30 y.o. G1P0000 at [redacted]w[redacted]d being seen today for ongoing prenatal care.  She is currently monitored for the following issues for this low-risk pregnancy and has Supervision of normal pregnancy on her problem list.  Patient reports heartburn that her zantac is not helping.  Contractions: Irregular. Vag. Bleeding: None.  Movement: Present. Denies leaking of fluid.   The following portions of the patient's history were reviewed and updated as appropriate: allergies, current medications, past family history, past medical history, past social history, past surgical history and problem list. Problem list updated.  Objective:   Filed Vitals:   03/28/15 0953  BP: 123/86  Pulse: 97  Weight: 158 lb (71.668 kg)    Fetal Status: Fetal Heart Rate (bpm): 147 Fundal Height: 37 cm Movement: Present     General:  Alert, oriented and cooperative. Patient is in no acute distress.  Skin: Skin is warm and dry. No rash noted.   Cardiovascular: Normal heart rate noted  Respiratory: Normal respiratory effort, no problems with respiration noted  Abdomen: Soft, gravid, appropriate for gestational age. Pain/Pressure: Absent     Pelvic: Vag. Bleeding: None Vag D/C Character: Thick   Cervical exam deferred        Extremities: Normal range of motion.  Edema: Mild pitting, slight indentation  Mental Status: Normal mood and affect. Normal behavior. Normal judgment and thought content.   Urinalysis: Urine Protein: Negative Urine Glucose: Negative  Assessment and Plan:  Pregnancy: G1P0000 at [redacted]w[redacted]d  1. Normal pregnancy, third trimester  - Culture, beta strep (group b only) - GC/chlamydia probe amp, urine  2. Supervision of normal pregnancy, third trimester - Change from Zantac to prilosec  Preterm labor symptoms and general obstetric precautions including but not limited to vaginal bleeding, contractions, leaking of fluid and fetal movement were reviewed in detail with the  patient. Please refer to After Visit Summary for other counseling recommendations.  Return in about 2 weeks (around 04/11/2015).   Allie Bossier, MD

## 2015-03-29 LAB — GC/CHLAMYDIA PROBE AMP
CT Probe RNA: NOT DETECTED
GC PROBE AMP APTIMA: NOT DETECTED

## 2015-03-30 LAB — CULTURE, BETA STREP (GROUP B ONLY)

## 2015-04-03 ENCOUNTER — Encounter: Payer: Self-pay | Admitting: Obstetrics & Gynecology

## 2015-04-03 DIAGNOSIS — O99820 Streptococcus B carrier state complicating pregnancy: Secondary | ICD-10-CM | POA: Insufficient documentation

## 2015-04-11 ENCOUNTER — Ambulatory Visit (INDEPENDENT_AMBULATORY_CARE_PROVIDER_SITE_OTHER): Payer: BLUE CROSS/BLUE SHIELD | Admitting: Obstetrics & Gynecology

## 2015-04-11 VITALS — BP 120/79 | HR 86 | Wt 164.0 lb

## 2015-04-11 DIAGNOSIS — Z3493 Encounter for supervision of normal pregnancy, unspecified, third trimester: Secondary | ICD-10-CM

## 2015-04-11 DIAGNOSIS — Z3403 Encounter for supervision of normal first pregnancy, third trimester: Secondary | ICD-10-CM

## 2015-04-11 DIAGNOSIS — Z22330 Carrier of Group B streptococcus: Secondary | ICD-10-CM

## 2015-04-11 DIAGNOSIS — O99820 Streptococcus B carrier state complicating pregnancy: Secondary | ICD-10-CM

## 2015-04-11 NOTE — Progress Notes (Signed)
Subjective:  Elizabeth Briggs is a 30 y.o. G1P0000 at 253w2d being seen today for ongoing prenatal care.  She is currently monitored for the following issues for this low-risk pregnancy and has Supervision of normal pregnancy and GBS (group B Streptococcus carrier), +RV culture, currently pregnant on her problem list.  Patient reports no complaints.  Contractions: Irritability. Vag. Bleeding: None.  Movement: Present. Denies leaking of fluid.   The following portions of the patient's history were reviewed and updated as appropriate: allergies, current medications, past family history, past medical history, past social history, past surgical history and problem list. Problem list updated.  Objective:   Filed Vitals:   04/11/15 0953  BP: 120/79  Pulse: 86  Weight: 164 lb (74.39 kg)    Fetal Status: Fetal Heart Rate (bpm): 150   Movement: Present     General:  Alert, oriented and cooperative. Patient is in no acute distress.  Skin: Skin is warm and dry. No rash noted.   Cardiovascular: Normal heart rate noted  Respiratory: Normal respiratory effort, no problems with respiration noted  Abdomen: Soft, gravid, appropriate for gestational age. Pain/Pressure: Absent     Pelvic: Vag. Bleeding: None Vag D/C Character: Thin   Cervical exam deferred        Extremities: Normal range of motion.  Edema: Mild pitting, slight indentation  Mental Status: Normal mood and affect. Normal behavior. Normal judgment and thought content.   Urinalysis: Urine Protein: Negative Urine Glucose: Negative  Assessment and Plan:  Pregnancy: G1P0000 at 253w2d  1. GBS (group B Streptococcus carrier), +RV culture, currently pregnant   2. Supervision of normal pregnancy, third trimester   Term labor symptoms and general obstetric precautions including but not limited to vaginal bleeding, contractions, leaking of fluid and fetal movement were reviewed in detail with the patient. Please refer to After Visit Summary for  other counseling recommendations.  Return in about 1 week (around 04/18/2015).   Allie BossierMyra C Shawnese Magner, MD

## 2015-04-18 ENCOUNTER — Ambulatory Visit (INDEPENDENT_AMBULATORY_CARE_PROVIDER_SITE_OTHER): Payer: BLUE CROSS/BLUE SHIELD | Admitting: Obstetrics & Gynecology

## 2015-04-18 VITALS — BP 123/87 | HR 97 | Wt 165.0 lb

## 2015-04-18 DIAGNOSIS — Z3403 Encounter for supervision of normal first pregnancy, third trimester: Secondary | ICD-10-CM

## 2015-04-18 DIAGNOSIS — Z3493 Encounter for supervision of normal pregnancy, unspecified, third trimester: Secondary | ICD-10-CM

## 2015-04-18 NOTE — Progress Notes (Signed)
Subjective:  Elizabeth Briggs is a 30 y.o. G1P0000 at 285w2d being seen today for ongoing prenatal care.  She is currently monitored for the following issues for this low-risk pregnancy and has Supervision of normal pregnancy and GBS (group B Streptococcus carrier), +RV culture, currently pregnant on her problem list.  Patient reports no complaints.  Contractions: Irritability. Vag. Bleeding: None.  Movement: Present. Denies leaking of fluid.   The following portions of the patient's history were reviewed and updated as appropriate: allergies, current medications, past family history, past medical history, past social history, past surgical history and problem list. Problem list updated.  Objective:   Filed Vitals:   04/18/15 0908  BP: 123/87  Pulse: 97  Weight: 165 lb (74.844 kg)    Fetal Status: Fetal Heart Rate (bpm): 135 Fundal Height: 38 cm Movement: Present  Presentation: Vertex  General:  Alert, oriented and cooperative. Patient is in no acute distress.  Skin: Skin is warm and dry. No rash noted.   Cardiovascular: Normal heart rate noted  Respiratory: Normal respiratory effort, no problems with respiration noted  Abdomen: Soft, gravid, appropriate for gestational age. Pain/Pressure: Present     Pelvic: Vag. Bleeding: None Vag D/C Character: Thin   Cervical exam performed Dilation: Closed Effacement (%): 50 Station: -3  Extremities: Normal range of motion.  Edema: Mild pitting, slight indentation  Mental Status: Normal mood and affect. Normal behavior. Normal judgment and thought content.   Urinalysis: Urine Protein: Trace Urine Glucose: Negative  Assessment and Plan:  Pregnancy: G1P0000 at 585w2d  1. Supervision of normal pregnancy, third trimester babyscripts compliant NST next visit  Term labor symptoms and general obstetric precautions including but not limited to vaginal bleeding, contractions, leaking of fluid and fetal movement were reviewed in detail with the  patient. Please refer to After Visit Summary for other counseling recommendations.  Return in about 1 week (around 04/25/2015).   Lesly DukesKelly H Matt Delpizzo, MD

## 2015-04-23 ENCOUNTER — Inpatient Hospital Stay (HOSPITAL_COMMUNITY)
Admission: AD | Admit: 2015-04-23 | Payer: BLUE CROSS/BLUE SHIELD | Source: Ambulatory Visit | Admitting: Obstetrics & Gynecology

## 2015-04-25 ENCOUNTER — Encounter: Payer: Self-pay | Admitting: *Deleted

## 2015-04-25 ENCOUNTER — Ambulatory Visit (INDEPENDENT_AMBULATORY_CARE_PROVIDER_SITE_OTHER): Payer: BLUE CROSS/BLUE SHIELD | Admitting: Obstetrics & Gynecology

## 2015-04-25 ENCOUNTER — Telehealth (HOSPITAL_COMMUNITY): Payer: Self-pay | Admitting: *Deleted

## 2015-04-25 VITALS — BP 127/87 | HR 73 | Wt 168.0 lb

## 2015-04-25 DIAGNOSIS — O480 Post-term pregnancy: Secondary | ICD-10-CM

## 2015-04-25 NOTE — Progress Notes (Signed)
Subjective:  Ronne BinningGrace D Brunty is a 30 y.o. G1P0000 at 8915w2d being seen today for ongoing prenatal care.  She is currently monitored for the following issues for this low-risk pregnancy and has Supervision of normal pregnancy and GBS (group B Streptococcus carrier), +RV culture, currently pregnant on her problem list.  Patient reports no complaints.  Contractions: Irritability. Vag. Bleeding: None.  Movement: Present. Denies leaking of fluid.   The following portions of the patient's history were reviewed and updated as appropriate: allergies, current medications, past family history, past medical history, past social history, past surgical history and problem list. Problem list updated.  Objective:   Filed Vitals:   04/25/15 0945  BP: 127/87  Pulse: 73  Weight: 168 lb (76.204 kg)    Fetal Status: Fetal Heart Rate (bpm): NST-R   Movement: Present     General:  Alert, oriented and cooperative. Patient is in no acute distress.  Skin: Skin is warm and dry. No rash noted.   Cardiovascular: Normal heart rate noted  Respiratory: Normal respiratory effort, no problems with respiration noted  Abdomen: Soft, gravid, appropriate for gestational age. Pain/Pressure: Present     Pelvic: Vag. Bleeding: None Vag D/C Character: Thick   Cervical exam performed        Extremities: Normal range of motion.  Edema: Mild pitting, slight indentation  Mental Status: Normal mood and affect. Normal behavior. Normal judgment and thought content.   Urinalysis: Urine Protein: Negative Urine Glucose: Negative  Assessment and Plan:  Pregnancy: G1P0000 at 9915w2d  1. Post-term pregnancy, 40-42 weeks of gestation - Schedule IOL for 41 weeks - Fetal nonstress test; Future  Term labor symptoms and general obstetric precautions including but not limited to vaginal bleeding, contractions, leaking of fluid and fetal movement were reviewed in detail with the patient. Please refer to After Visit Summary for other counseling  recommendations.  [facility]   Allie BossierMyra C Sundance Moise, MD

## 2015-04-25 NOTE — Progress Notes (Signed)
Foley bulb insertion scheduled for Friday and IOL 04/28/15 @ 11:45 PM

## 2015-04-25 NOTE — Telephone Encounter (Signed)
Preadmission screen  

## 2015-04-26 ENCOUNTER — Encounter (HOSPITAL_COMMUNITY): Payer: Self-pay | Admitting: *Deleted

## 2015-04-26 ENCOUNTER — Telehealth (HOSPITAL_COMMUNITY): Payer: Self-pay | Admitting: *Deleted

## 2015-04-26 NOTE — Telephone Encounter (Signed)
Preadmission screen  

## 2015-04-27 ENCOUNTER — Ambulatory Visit (INDEPENDENT_AMBULATORY_CARE_PROVIDER_SITE_OTHER): Payer: BLUE CROSS/BLUE SHIELD | Admitting: Family

## 2015-04-27 ENCOUNTER — Other Ambulatory Visit: Payer: Self-pay | Admitting: Advanced Practice Midwife

## 2015-04-27 VITALS — BP 123/86 | HR 82 | Wt 168.0 lb

## 2015-04-27 DIAGNOSIS — Z3493 Encounter for supervision of normal pregnancy, unspecified, third trimester: Secondary | ICD-10-CM

## 2015-04-27 DIAGNOSIS — O480 Post-term pregnancy: Secondary | ICD-10-CM

## 2015-04-27 DIAGNOSIS — Z3403 Encounter for supervision of normal first pregnancy, third trimester: Secondary | ICD-10-CM

## 2015-04-27 NOTE — Progress Notes (Signed)
Subjective:  Elizabeth Briggs is a 30 y.o. G1P0000 at 6450w4d being seen today for ongoing prenatal care.  She is currently monitored for the following issues for this low-risk pregnancy and has Supervision of normal pregnancy and GBS (group B Streptococcus carrier), +RV culture, currently pregnant on her problem list.  Patient reports occasional contractions.  Contractions: Irritability. Vag. Bleeding: Scant.  Movement: Present. Denies leaking of fluid.   The following portions of the patient's history were reviewed and updated as appropriate: allergies, current medications, past family history, past medical history, past social history, past surgical history and problem list. Problem list updated.  Objective:   Filed Vitals:   04/27/15 1034  BP: 123/86  Pulse: 82  Weight: 168 lb (76.204 kg)    Fetal Status:     Movement: Present     General:  Alert, oriented and cooperative. Patient is in no acute distress.  Skin: Skin is warm and dry. No rash noted.   Cardiovascular: Normal heart rate noted  Respiratory: Normal respiratory effort, no problems with respiration noted  Abdomen: Soft, gravid, appropriate for gestational age. Pain/Pressure: Present     Pelvic: Vag. Bleeding: Scant Vag D/C Character: Thin   Cervical exam performed      0.5/50/-3  Extremities: Normal range of motion.     Mental Status: Normal mood and affect. Normal behavior. Normal judgment and thought content.   Urinalysis: Urine Protein: Negative Urine Glucose: Negative  Assessment and Plan:  Pregnancy: G1P0000 at 3550w4d  1. Supervision of normal pregnancy, third trimester - Discussed unable to place Foley bulb due to cervical dilation and the timing of IOL, need AM appt  Term labor symptoms and general obstetric precautions including but not limited to vaginal bleeding, contractions, leaking of fluid and fetal movement were reviewed in detail with the patient. Please refer to After Visit Summary for other counseling  recommendations.   IOL 04/28/15  Marlis EdelsonWalidah N Karim, CNM

## 2015-04-29 ENCOUNTER — Encounter (HOSPITAL_COMMUNITY): Payer: Self-pay

## 2015-04-29 ENCOUNTER — Inpatient Hospital Stay (HOSPITAL_COMMUNITY)
Admission: RE | Admit: 2015-04-29 | Discharge: 2015-05-02 | DRG: 775 | Disposition: A | Discharge status: Home / Self Care | Payer: BLUE CROSS/BLUE SHIELD | Source: Ambulatory Visit | Attending: Family Medicine | Admitting: Family Medicine

## 2015-04-29 VITALS — BP 116/70 | HR 83 | Temp 98.1°F | Resp 20 | Ht 62.0 in | Wt 168.0 lb

## 2015-04-29 DIAGNOSIS — Z3A41 41 weeks gestation of pregnancy: Secondary | ICD-10-CM | POA: Diagnosis not present

## 2015-04-29 DIAGNOSIS — O99824 Streptococcus B carrier state complicating childbirth: Secondary | ICD-10-CM | POA: Diagnosis present

## 2015-04-29 DIAGNOSIS — O480 Post-term pregnancy: Secondary | ICD-10-CM | POA: Diagnosis present

## 2015-04-29 DIAGNOSIS — Z833 Family history of diabetes mellitus: Secondary | ICD-10-CM

## 2015-04-29 DIAGNOSIS — Z87891 Personal history of nicotine dependence: Secondary | ICD-10-CM

## 2015-04-29 DIAGNOSIS — O99820 Streptococcus B carrier state complicating pregnancy: Secondary | ICD-10-CM

## 2015-04-29 DIAGNOSIS — Z3A40 40 weeks gestation of pregnancy: Secondary | ICD-10-CM | POA: Diagnosis not present

## 2015-04-29 LAB — CBC
HEMATOCRIT: 35.3 % — AB (ref 36.0–46.0)
HEMOGLOBIN: 11.8 g/dL — AB (ref 12.0–15.0)
MCH: 28.9 pg (ref 26.0–34.0)
MCHC: 33.4 g/dL (ref 30.0–36.0)
MCV: 86.3 fL (ref 78.0–100.0)
Platelets: 160 10*3/uL (ref 150–400)
RBC: 4.09 MIL/uL (ref 3.87–5.11)
RDW: 14.1 % (ref 11.5–15.5)
WBC: 6.9 10*3/uL (ref 4.0–10.5)

## 2015-04-29 LAB — RPR: RPR: NONREACTIVE

## 2015-04-29 LAB — TYPE AND SCREEN
ABO/RH(D): O POS
ANTIBODY SCREEN: NEGATIVE

## 2015-04-29 LAB — ABO/RH: ABO/RH(D): O POS

## 2015-04-29 MED ORDER — PENICILLIN G POTASSIUM 5000000 UNITS IJ SOLR
2.5000 10*6.[IU] | INTRAMUSCULAR | Status: DC
  Filled 2015-04-29 (×4): qty 2.5

## 2015-04-29 MED ORDER — CITRIC ACID-SODIUM CITRATE 334-500 MG/5ML PO SOLN: 30.0000 mL | ORAL | Status: DC | PRN

## 2015-04-29 MED ORDER — OXYTOCIN BOLUS FROM INFUSION: 500.0000 mL | INTRAVENOUS | Status: DC

## 2015-04-29 MED ORDER — OXYTOCIN 10 UNIT/ML IJ SOLN
1.0000 m[IU]/min | INTRAVENOUS | Status: DC
  Administered 2015-04-29: 2 m[IU]/min via INTRAVENOUS
  Filled 2015-04-29: qty 4

## 2015-04-29 MED ORDER — ACETAMINOPHEN 325 MG PO TABS
650.0000 mg | ORAL_TABLET | ORAL | Status: DC | PRN
Start: 2015-04-29 — End: 2015-04-30

## 2015-04-29 MED ORDER — PENICILLIN G POTASSIUM 5000000 UNITS IJ SOLR
5.0000 10*6.[IU] | Freq: Once | INTRAVENOUS | Status: DC
  Filled 2015-04-29: qty 5

## 2015-04-29 MED ORDER — DEXTROSE 5 % IV SOLN
2.5000 10*6.[IU] | INTRAVENOUS | Status: DC
  Administered 2015-04-29 – 2015-04-30 (×6): 2.5 10*6.[IU] via INTRAVENOUS
  Filled 2015-04-29 (×8): qty 2.5

## 2015-04-29 MED ORDER — MISOPROSTOL 25 MCG QUARTER TABLET
25.0000 ug | ORAL_TABLET | ORAL | Status: DC
  Administered 2015-04-29 (×3): 25 ug via VAGINAL
  Filled 2015-04-29: qty 0.25
  Filled 2015-04-29: qty 1
  Filled 2015-04-29 (×2): qty 0.25
  Filled 2015-04-29 (×3): qty 1

## 2015-04-29 MED ORDER — ONDANSETRON HCL 4 MG/2ML IJ SOLN
4.0000 mg | Freq: Four times a day (QID) | INTRAMUSCULAR | Status: DC | PRN
  Administered 2015-04-30: 4 mg via INTRAVENOUS
  Filled 2015-04-29: qty 2

## 2015-04-29 MED ORDER — LIDOCAINE HCL (PF) 1 % IJ SOLN
30.0000 mL | INTRAMUSCULAR | Status: DC | PRN
  Filled 2015-04-29: qty 30

## 2015-04-29 MED ORDER — FENTANYL CITRATE (PF) 100 MCG/2ML IJ SOLN
100.0000 ug | INTRAMUSCULAR | Status: DC | PRN
  Administered 2015-04-29 (×3): 100 ug via INTRAVENOUS
  Filled 2015-04-29 (×3): qty 2

## 2015-04-29 MED ORDER — PANTOPRAZOLE SODIUM 40 MG PO TBEC
40.0000 mg | DELAYED_RELEASE_TABLET | Freq: Every day | ORAL | Status: DC
  Administered 2015-04-30: 40 mg via ORAL
  Filled 2015-04-29: qty 1

## 2015-04-29 MED ORDER — PENICILLIN G POTASSIUM 5000000 UNITS IJ SOLR
5.0000 10*6.[IU] | Freq: Once | INTRAVENOUS | Status: AC
  Administered 2015-04-29: 5 10*6.[IU] via INTRAVENOUS
  Filled 2015-04-29: qty 5

## 2015-04-29 MED ORDER — LACTATED RINGERS IV SOLN
500.0000 mL | INTRAVENOUS | Status: DC | PRN
  Administered 2015-04-30: 500 mL via INTRAVENOUS

## 2015-04-29 MED ORDER — LACTATED RINGERS IV SOLN
INTRAVENOUS | Status: DC
  Administered 2015-04-29 – 2015-04-30 (×6): via INTRAVENOUS

## 2015-04-29 MED ORDER — OXYCODONE-ACETAMINOPHEN 5-325 MG PO TABS: 1.0000 | ORAL_TABLET | ORAL | Status: DC | PRN

## 2015-04-29 MED ORDER — LACTATED RINGERS IV SOLN: 2.5000 [IU]/h | INTRAVENOUS | Status: DC

## 2015-04-29 MED ORDER — TERBUTALINE SULFATE 1 MG/ML IJ SOLN
0.2500 mg | Freq: Once | INTRAMUSCULAR | Status: DC | PRN
  Filled 2015-04-29: qty 1

## 2015-04-29 MED ORDER — OXYCODONE-ACETAMINOPHEN 5-325 MG PO TABS: 2.0000 | ORAL_TABLET | ORAL | Status: DC | PRN

## 2015-04-29 MED ORDER — ZOLPIDEM TARTRATE 5 MG PO TABS: 5.0000 mg | ORAL_TABLET | Freq: Every evening | ORAL | Status: DC | PRN

## 2015-04-29 NOTE — Consults (Signed)
  Anesthesia Pain Consult Note  Patient: Elizabeth Briggs, 30 y.o., female  Consult Requested by: Lazaro ArmsLuther H Eure, MD  Reason for Consult: CRNA Pain Management  Epidural planned Pain 0 Goal 6

## 2015-04-29 NOTE — Progress Notes (Signed)
LABOR PROGRESS NOTE  Ronne BinningGrace D Uzelac is a 30 y.o. G1P0000 at 7290w6d  admitted for IOL for post dates  Subjective: Patient reports that she was feeling the contractions earlier, but then she fell asleep and is no longer feeling them.  Has not needed pain medication yet.  Voices no concerns at this time.  Objective: BP 117/78 mmHg  Pulse 64  Temp(Src) 98.5 F (36.9 C) (Oral)  Resp 16  Ht 5\' 2"  (1.575 m)  Wt 168 lb (76.204 kg)  BMI 30.72 kg/m2  LMP 07/17/2014 or  Filed Vitals:   04/29/15 0301 04/29/15 0316 04/29/15 0400 04/29/15 0455  BP: 111/66 109/71  117/78  Pulse: 67 64  64  Temp:    98.5 F (36.9 C)  TempSrc:    Oral  Resp:  16 16 16   Height:      Weight:        Fetal monitoringBaseline: 126 bpm, Variability: Good {> 6 bpm) and Accelerations: Reactive Uterine activity Irregular   Dilation: Fingertip Effacement (%): 40 Station: -3 Exam by:: Kingsley CallanderKaren Smith RNC  Labs: Lab Results  Component Value Date   WBC 6.9 04/29/2015   HGB 11.8* 04/29/2015   HCT 35.3* 04/29/2015   MCV 86.3 04/29/2015   PLT 160 04/29/2015    Patient Active Problem List   Diagnosis Date Noted  . Post term pregnancy over 40 weeks 04/29/2015  . GBS (group B Streptococcus carrier), +RV culture, currently pregnant 04/03/2015  . Supervision of normal pregnancy 09/08/2014    Assessment / Plan: 30 y.o. G1P0000 at 4890w6d here for IOL for post dates  Labor: IOL.  Once cervix ripened, will plan to initiate Pitocin Fetal Wellbeing:  Cat 1 Pain Control:  Fentanyl prn, though not needing anything yet Anticipated MOD:  anticipate SVD  Delynn FlavinAshly Lankford Gutzmer, DO PGY-2, [facility] 04/29/2015, 5:11 AM

## 2015-04-29 NOTE — Progress Notes (Signed)
Patient ID: Elizabeth Briggs, female   DOB: 03-02-1985, 30 y.o.   MRN: [id number] O' using sterile technique foly bulb inserted thru cervix and inflated with 60cc sterile fluid. Pt  And fetus tolerated procedure well.

## 2015-04-29 NOTE — Progress Notes (Signed)
[id number] Assumed care of patient.

## 2015-04-29 NOTE — H&P (Signed)
LABOR ADMISSION HISTORY AND PHYSICAL  Elizabeth Briggs is a 30 y.o. female G1P0000 with IUP at [redacted]w[redacted]d by early ultrasound presenting for IOL. She reports +FMs.  Denies LOF, VB, blurry vision, headaches, RUQ pain.  Endorses LE edema.  She plans on breast feeding. She request natural family planning for birth control.  Plans on bringing child to Dr Dorothe Pea in [place] for pediatric care after discharge.  Dating: By 7.5 wk ultrasound --->  Estimated Date of Delivery: 04/23/15  Sono:    , CWD, normal anatomy, cephalic presentation, 760g, 61% EFW   Prenatal History/Complications:  Clinic [facility] Prenatal Labs  Dating  7.5 wk [place] Blood type: O/POS/-- (08/12 1155)   Genetic Screen  NIPS: collected 9/12 Neg  GIRL Antibody:NEG (08/12 1155)  Anatomic [place] nml [place] except for limited view Cord insertion Rubella: 0.67 (08/12 1155)  GTT  Third trimester: 95 RPR: NON REAC (08/12 1155)   Flu vaccine 12/13/14  HBsAg: NEGATIVE (08/12 1155)   TDaP vaccine  01/31/15                                 HIV: NONREACTIVE (08/12 1155)   Baby Food   Breast                                  GBS: (For PCN allergy, check sensitivities) +  Contraception   Coitus interruptus Pap: 12/2013 - Normal  Circumcision   Probably yes   Pediatrician [facility] peds   Support Person Tawanna Cooler husband     Past Medical History: Past Medical History  Diagnosis Date  . Abnormal Pap smear of cervix 2004    Past Surgical History: Past Surgical History  Procedure Laterality Date  . Mouth surgery    . Colposcopy      Obstetrical History: OB History    Gravida Para Term Preterm AB TAB SAB Ectopic Multiple Living        Social History: Social History   Social History  . Marital Status: Married    Spouse Name: N/A  . Number of Children: N/A  . Years of Education: N/A   Social History Main Topics  . Smoking status: Former Smoker    Quit date: 09/08/2013  . Smokeless tobacco: Never Used  .  Alcohol Use: 0.0 oz/week    0 Standard drinks or equivalent per week     Comment: Occasional  . Drug Use: No  . Sexual Activity:    Partners: Male    Birth Control/ Protection: None   Other Topics Concern  . None   Social History Narrative    Family History: Family History  Problem Relation Age of Onset  . Hyperlipidemia Mother   . Diabetes type I Brother   . Prostate cancer Father 41  . Endometriosis Mother     Allergies: No Known Allergies  Prescriptions prior to admission  Medication Sig Dispense Refill Last Dose  . magnesium hydroxide (MILK OF MAGNESIA) 400 MG/5ML suspension Take by mouth daily as needed for mild constipation.   Taking  . Misc. Devices (BREAST PUMP) MISC Dispense one breast pump for patient 1 each 0 Taking  . omeprazole (PRILOSEC) 20 MG capsule Take 1 capsule (20 mg total) by mouth daily. 31 capsule 6 Taking  . Prenatal Vit-Fe Fumarate-FA (MULTIVITAMIN-PRENATAL) 27-0.8  MG TABS tablet Take 1 tablet by mouth daily at 12 noon.   Taking     Review of Systems   All systems reviewed and negative except as stated in HPI  Blood pressure 128/76, pulse 76, temperature 98.7 F (37.1 C), temperature source Oral, resp. rate 16, height 5\' 2"  (1.575 m), weight 168 lb (76.204 kg), last menstrual period 07/17/2014. General appearance: alert, cooperative, appears stated age and no distress Lungs: clear to auscultation bilaterally, no increased WOB Heart: regular rate and rhythm, no m/r/g Abdomen: gravid, soft, non-tender; bowel sounds normal Pelvic: external vaginal tissue normal appearing, infant vertex. Dilation: Fingertip Effacement (%): 40 Station: -3 Exam by:: Dr. Nadine CountsGottschalk Extremities: WWP, Homans sign is negative, no sign of DVT, +2 DP, +1 pedal edema Neuro: follows commands, no focal deficits Presentation: cephalic Fetal monitoringBaseline: 135 bpm, Variability: Good {> 6 bpm) and Accelerations: Reactive Uterine activity: irregular, Frequency: Every  5-8 minutes Dilation: Fingertip Effacement (%): 40 Station: -3 Exam by:: Dr. Nadine CountsGottschalk Prenatal labs: ABO, Rh: O/POS/-- (08/12 1155) Antibody: NEG (08/12 1155) Rubella: !Error! 0.67 (not immune) RPR: NON REAC (01/04 1007)  HBsAg: NEGATIVE (08/12 1155)  HIV: NONREACTIVE (01/04 1007)  GBS: Positive (03/01 0000)  1 hr Glucola (3rd trimester): 95 Genetic screening  NIPS negative Anatomy US normal  Prenatal Transfer Tool  Maternal Diabetes: No Genetic Screening: Normal Maternal Ultrasounds/Referrals: Normal Fetal Ultrasounds or other Referrals:  None Maternal Substance Abuse:  No Significant Maternal Medications:  Meds include: Other: Prilosec Significant Maternal Lab Results: Lab values include: Group B Strep positive, Other: Rubella: Not immune  Results for orders placed or performed during the hospital encounter of 04/29/15 (from the past 24 hour(s))  CBC   Collection Time: 04/29/15 12:45 AM  Result Value Ref Range   WBC 6.9 4.0 - 10.5 K/uL   RBC 4.09 3.87 - 5.11 MIL/uL   Hemoglobin 11.8 (L) 12.0 - 15.0 g/dL   HCT 95.235.3 (L) 84.136.0 - 32.446.0 %   MCV 86.3 78.0 - 100.0 fL   MCH 28.9 26.0 - 34.0 pg   MCHC 33.4 30.0 - 36.0 g/dL   RDW 40.114.1 02.711.5 - 25.315.5 %   Platelets 160 150 - 400 K/uL    Patient Active Problem List   Diagnosis Date Noted  . Post term pregnancy over 40 weeks 04/29/2015  . GBS (group B Streptococcus carrier), +RV culture, currently pregnant 04/03/2015  . Supervision of normal pregnancy 09/08/2014    Assessment: Ronne BinningGrace D Wirtanen is a 30 y.o. G1P0000 at 6919w6d here for IOL for postdates   #Labor: IOL. Cytotec 25 mg applied to cervix.  Anticipate NSVD #Pain: Fentanyl 100mg  prn #FWB: Cat 1 #ID:  GBS+, PCN ordered #MOF: Breast #MOC:Natural family planning #Circ:  Not applicable. Female baby.   Delynn FlavinAshly Gottschalk, DO PGY-2, [facility] 04/29/2015, 1:15 AM   I spoke with and examined patient and agree with resident/PA/SNM's note and plan of care.   319w6d G1P0000 here for IOL d/t postdates. Cx Ft/40/-3, vtx by Dr. Nadine CountsGottschalk, plan is for cytotec 25mcg pv q 4hr until able to place cervical foley bulb.  Reports good fm, not really feeling any uc's, no vb or lof.  Plans to breastfeed, coitus interruptus for contraception. Female baby. Will start PCN for gbs+ w/ labor/rom/pitocin, whichever comes first.  Cat I FHR/reactive NST, irregular mild uc's.   Cheral MarkerKimberly R. Booker, CNM, WHNP-BC 04/29/2015 1:50 AM

## 2015-04-29 NOTE — Progress Notes (Signed)
LABOR PROGRESS NOTE  Elizabeth Briggs is a 30 y.o. G1P0000 at 539w6d  admitted for IOL for post dates  Subjective: Patient reports that her pain is controlled.  Upon arrival to room, RN in room repositioning patient for infant HR 90's.  HR back into the 150-160s after placing patient on Left lateral.  Objective: BP 112/71 mmHg  Pulse 86  Temp(Src) 98.7 F (37.1 C) (Oral)  Resp 18  Ht 5\' 2"  (1.575 m)  Wt 168 lb (76.204 kg)  BMI 30.72 kg/m2  LMP 07/17/2014 or  Filed Vitals:   04/29/15 2033 04/29/15 2101 04/29/15 2131 04/29/15 2301  BP: 107/76 144/88 133/70 112/71  Pulse: 79 72 83 86  Temp:   98.7 F (37.1 C)   TempSrc:   Oral   Resp: 16 18 18 18   Height:      Weight:        Fetal monitoringBaseline: 155 bpm, Variability: Fair (1-6 bpm), Accelerations: Reactive and Decelerations: x1. resolved with repositioning Uterine activity irregular   Dilation: 1 Effacement (%): 70 Station: -2 Presentation: Vertex Exam by:: Zerita Boersarlene Lawson CNM  Labs: Lab Results  Component Value Date   WBC 6.9 04/29/2015   HGB 11.8* 04/29/2015   HCT 35.3* 04/29/2015   MCV 86.3 04/29/2015   PLT 160 04/29/2015    Patient Active Problem List   Diagnosis Date Noted  . Post term pregnancy over 40 weeks 04/29/2015  . GBS (group B Streptococcus carrier), +RV culture, currently pregnant 04/03/2015  . Supervision of normal pregnancy 09/08/2014    Assessment / Plan: 30 y.o. G1P0000 at 3339w6d here for IOL.  Foley bulb in place  Labor: IOL Fetal Wellbeing:  Cat 2 Pain Control:  Fentanyl IV Anticipated MOD:  SVD  Ashly Gottschalk, DO 04/29/2015, 11:13 PM

## 2015-04-29 NOTE — Progress Notes (Signed)
Elizabeth Briggs is a 30 y.o. G1P0000 at 3856w6d by ultrasound admitted for induction of labor due to Post dates. Due date 04/23/15.  Subjective:   Objective: BP 121/77 mmHg  Pulse 71  Temp(Src) 98.7 F (37.1 C) (Oral)  Resp 20  Ht 5\' 2"  (1.575 m)  Wt 168 lb (76.204 kg)  BMI 30.72 kg/m2  LMP 07/17/2014      FHT:  FHR: 140's bpm, variability: moderate,  accelerations:  Present,  decelerations:  Present absent UC:   regular, every 2-4 minutes SVE:   Dilation: Fingertip Effacement (%): 40 Station: -3 Exam by:: Elizabeth CallanderKaren Briggs RNC  Labs: Lab Results  Component Value Date   WBC 6.9 04/29/2015   HGB 11.8* 04/29/2015   HCT 35.3* 04/29/2015   MCV 86.3 04/29/2015   PLT 160 04/29/2015    Assessment / Plan: yet to be in labor  Labor: not in labor Preeclampsia:  no signs or symptoms of toxicity and intake and ouput balanced Fetal Wellbeing:  Category I Pain Control:  Labor support without medications I/D:  n/a Anticipated MOD:  NSVD  Elizabeth Briggs 04/29/2015, 10:23 AM

## 2015-04-29 NOTE — Progress Notes (Signed)
Elizabeth Briggs is a 30 y.o. G1P0000 at 1922w6d by ultrasound admitted for induction of labor due to Post dates. Due date 04/23/15.  Subjective:   Objective: BP 119/76 mmHg  Pulse 70  Temp(Src) 98.1 F (36.7 C) (Oral)  Resp 16  Ht 5\' 2"  (1.575 m)  Wt 168 lb (76.204 kg)  BMI 30.72 kg/m2  LMP 07/17/2014      FHT:  FHR: 150's bpm, variability: moderate,  accelerations:  Present,  decelerations:  Absent UC:   regular, every 2 minutes and mild SVE:   Dilation: 1 Effacement (%): 80 Station: -2 Exam by:: k fields, rn  Labs: Lab Results  Component Value Date   WBC 6.9 04/29/2015   HGB 11.8* 04/29/2015   HCT 35.3* 04/29/2015   MCV 86.3 04/29/2015   PLT 160 04/29/2015    Assessment / Plan: yet to be in labor  Labor: Progressing normally Preeclampsia:  no signs or symptoms of toxicity and intake and ouput balanced Fetal Wellbeing:  Category I Pain Control:  Labor support without medications I/D:  n/a Anticipated MOD:  NSVD  Elizabeth Briggs Elizabeth Briggs 04/29/2015, 6:30 PM

## 2015-04-29 NOTE — Progress Notes (Signed)
CNM notified contracting too much for cytotec placement. CNM attending emergency. Will arrive shortly for POC.

## 2015-04-30 ENCOUNTER — Encounter (HOSPITAL_COMMUNITY): Payer: Self-pay

## 2015-04-30 ENCOUNTER — Inpatient Hospital Stay (HOSPITAL_COMMUNITY): Payer: BLUE CROSS/BLUE SHIELD | Admitting: Anesthesiology

## 2015-04-30 DIAGNOSIS — Z3A40 40 weeks gestation of pregnancy: Secondary | ICD-10-CM

## 2015-04-30 DIAGNOSIS — O99824 Streptococcus B carrier state complicating childbirth: Secondary | ICD-10-CM

## 2015-04-30 DIAGNOSIS — O480 Post-term pregnancy: Secondary | ICD-10-CM

## 2015-04-30 LAB — CBC
HEMATOCRIT: 35.8 % — AB (ref 36.0–46.0)
HEMOGLOBIN: 12 g/dL (ref 12.0–15.0)
MCH: 28.8 pg (ref 26.0–34.0)
MCHC: 33.5 g/dL (ref 30.0–36.0)
MCV: 86.1 fL (ref 78.0–100.0)
Platelets: 172 10*3/uL (ref 150–400)
RBC: 4.16 MIL/uL (ref 3.87–5.11)
RDW: 14.2 % (ref 11.5–15.5)
WBC: 10.5 10*3/uL (ref 4.0–10.5)

## 2015-04-30 MED ORDER — ONDANSETRON HCL 4 MG PO TABS: 4.0000 mg | ORAL_TABLET | ORAL | Status: DC | PRN

## 2015-04-30 MED ORDER — LANOLIN HYDROUS EX OINT: TOPICAL_OINTMENT | CUTANEOUS | Status: DC | PRN

## 2015-04-30 MED ORDER — EPHEDRINE 5 MG/ML INJ
10.0000 mg | INTRAVENOUS | Status: DC | PRN
  Filled 2015-04-30: qty 2

## 2015-04-30 MED ORDER — LACTATED RINGERS IV SOLN
500.0000 mL | Freq: Once | INTRAVENOUS | Status: AC
  Administered 2015-04-30: 500 mL via INTRAVENOUS

## 2015-04-30 MED ORDER — DIPHENHYDRAMINE HCL 25 MG PO CAPS: 25.0000 mg | ORAL_CAPSULE | Freq: Four times a day (QID) | ORAL | Status: DC | PRN

## 2015-04-30 MED ORDER — PHENYLEPHRINE 40 MCG/ML (10ML) SYRINGE FOR IV PUSH (FOR BLOOD PRESSURE SUPPORT)
80.0000 ug | PREFILLED_SYRINGE | INTRAVENOUS | Status: DC | PRN
Start: 2015-04-30 — End: 2015-04-30
  Filled 2015-04-30: qty 2

## 2015-04-30 MED ORDER — WITCH HAZEL-GLYCERIN EX PADS: 1.0000 "application " | MEDICATED_PAD | CUTANEOUS | Status: DC | PRN

## 2015-04-30 MED ORDER — FENTANYL 2.5 MCG/ML BUPIVACAINE 1/10 % EPIDURAL INFUSION (WH - ANES)
14.0000 mL/h | INTRAMUSCULAR | Status: DC | PRN
  Administered 2015-04-30 (×2): 14 mL/h via EPIDURAL
  Filled 2015-04-30 (×2): qty 125

## 2015-04-30 MED ORDER — ACETAMINOPHEN 325 MG PO TABS: 650.0000 mg | ORAL_TABLET | ORAL | Status: DC | PRN

## 2015-04-30 MED ORDER — IBUPROFEN 600 MG PO TABS
600.0000 mg | ORAL_TABLET | Freq: Four times a day (QID) | ORAL | Status: DC
  Administered 2015-05-01 – 2015-05-02 (×7): 600 mg via ORAL
  Filled 2015-04-30 (×7): qty 1

## 2015-04-30 MED ORDER — SENNOSIDES-DOCUSATE SODIUM 8.6-50 MG PO TABS
2.0000 | ORAL_TABLET | ORAL | Status: DC
  Administered 2015-05-01 (×2): 2 via ORAL
  Filled 2015-04-30 (×2): qty 2

## 2015-04-30 MED ORDER — PRENATAL MULTIVITAMIN CH
1.0000 | ORAL_TABLET | Freq: Every day | ORAL | Status: DC
  Administered 2015-05-01 – 2015-05-02 (×2): 1 via ORAL
  Filled 2015-04-30 (×2): qty 1

## 2015-04-30 MED ORDER — TETANUS-DIPHTH-ACELL PERTUSSIS 5-2.5-18.5 LF-MCG/0.5 IM SUSP: 0.5000 mL | Freq: Once | INTRAMUSCULAR | Status: DC

## 2015-04-30 MED ORDER — LIDOCAINE HCL (PF) 1 % IJ SOLN
INTRAMUSCULAR | Status: DC | PRN
  Administered 2015-04-30 (×2): 5 mL

## 2015-04-30 MED ORDER — ZOLPIDEM TARTRATE 5 MG PO TABS: 5.0000 mg | ORAL_TABLET | Freq: Every evening | ORAL | Status: DC | PRN

## 2015-04-30 MED ORDER — BENZOCAINE-MENTHOL 20-0.5 % EX AERO
1.0000 | INHALATION_SPRAY | CUTANEOUS | Status: DC | PRN
Start: 2015-04-30 — End: 2015-05-02

## 2015-04-30 MED ORDER — PHENYLEPHRINE 40 MCG/ML (10ML) SYRINGE FOR IV PUSH (FOR BLOOD PRESSURE SUPPORT)
80.0000 ug | PREFILLED_SYRINGE | INTRAVENOUS | Status: DC | PRN
  Filled 2015-04-30: qty 2
  Filled 2015-04-30: qty 20

## 2015-04-30 MED ORDER — DIBUCAINE 1 % RE OINT: 1.0000 "application " | TOPICAL_OINTMENT | RECTAL | Status: DC | PRN

## 2015-04-30 MED ORDER — ONDANSETRON HCL 4 MG/2ML IJ SOLN: 4.0000 mg | INTRAMUSCULAR | Status: DC | PRN

## 2015-04-30 MED ORDER — SIMETHICONE 80 MG PO CHEW: 80.0000 mg | CHEWABLE_TABLET | ORAL | Status: DC | PRN

## 2015-04-30 MED ORDER — DIPHENHYDRAMINE HCL 50 MG/ML IJ SOLN: 12.5000 mg | INTRAMUSCULAR | Status: DC | PRN

## 2015-04-30 NOTE — Progress Notes (Signed)
LABOR PROGRESS NOTE  Elizabeth Briggs is a 30 y.o. G1P0000 at 3574w6d  admitted for IOL for post dates  Subjective: Patient reports that her pain is controlled.  No concerns at this time.  Objective: BP 113/49 mmHg  Pulse 75  Temp(Src) 99.5 F (37.5 C) (Oral)  Resp 18  Ht 5\' 2"  (1.575 m)  Wt 168 lb (76.204 kg)  BMI 30.72 kg/m2  SpO2 98%  LMP 07/17/2014 or  Filed Vitals:   04/30/15 0017 04/30/15 0023 04/30/15 0304 04/30/15 0328  BP:   113/49   Pulse: 66 109 75   Temp:   99.5 F (37.5 C)   TempSrc:   Oral   Resp:   18 18  Height:      Weight:      SpO2: 98%      Gen: resting in bed GU: foley bulb out Fetal monitoringBaseline: 145 bpm, Variability: Good {> 6 bpm), Accelerations: Reactive and Decelerations: Absent Uterine activity irregular, q2-8   Dilation: 5 Effacement (%): 70 Cervical Position: Middle Station: -2 Presentation: Vertex Exam by:: Barrie DunkerMurayyah Johnson RN  Labs: Lab Results  Component Value Date   WBC 6.9 04/29/2015   HGB 11.8* 04/29/2015   HCT 35.3* 04/29/2015   MCV 86.3 04/29/2015   PLT 160 04/29/2015    Patient Active Problem List   Diagnosis Date Noted  . Post term pregnancy over 40 weeks 04/29/2015  . GBS (group B Streptococcus carrier), +RV culture, currently pregnant 04/03/2015  . Supervision of normal pregnancy 09/08/2014    Assessment / Plan: 30 y.o. G1P0000 at 8774w6d here for IOL.  Foley bulb out.  Pitocin restarted.  Still at a -1/-2 station.  Head not yet well applied to cervix.  Bag palpable.  Will await for better application of head to cervix then AROM.  Labor: IOL, progressing well Fetal Wellbeing:  Cat 1 Pain Control:  Fentanyl IV, epidural on request Anticipated MOD:  SVD  Delynn FlavinAshly Gottschalk, DO PGY-2, [facility] 04/30/2015, 3:31 AM

## 2015-04-30 NOTE — Anesthesia Procedure Notes (Signed)
Epidural Patient location during procedure: OB  Staffing Anesthesiologist: Genisis Sonnier Performed by: anesthesiologist   Preanesthetic Checklist Completed: patient identified, site marked, surgical consent, pre-op evaluation, timeout performed, IV checked, risks and benefits discussed and monitors and equipment checked  Epidural Patient position: sitting Prep: DuraPrep Patient monitoring: heart rate, continuous pulse ox and blood pressure Approach: right paramedian Location: L3-L4 Injection technique: LOR saline  Needle:  Needle type: Tuohy  Needle gauge: 17 G Needle length: 9 cm and 9 Needle insertion depth: 5 cm Catheter type: closed end flexible Catheter size: 20 Guage Catheter at skin depth: 9 cm Test dose: negative  Assessment Events: blood not aspirated, injection not painful, no injection resistance, negative IV test and no paresthesia  Additional Notes Patient identified. Risks/Benefits/Options discussed with patient including but not limited to bleeding, infection, nerve damage, paralysis, failed block, incomplete pain control, headache, blood pressure changes, nausea, vomiting, reactions to medication both or allergic, itching and postpartum back pain. Confirmed with bedside nurse the patient's most recent platelet count. Confirmed with patient that they are not currently taking any anticoagulation, have any bleeding history or any family history of bleeding disorders. Patient expressed understanding and wished to proceed. All questions were answered. Sterile technique was used throughout the entire procedure. Please see nursing notes for vital signs. Test dose was given through epidural needle and negative prior to continuing to dose epidural or start infusion. Warning signs of high block given to the patient including shortness of breath, tingling/numbness in hands, complete motor block, or any concerning symptoms with instructions to call for help. Patient was given  instructions on fall risk and not to get out of bed. All questions and concerns addressed with instructions to call with any issues.   

## 2015-04-30 NOTE — Consult Note (Signed)
Neonatology Note:  Attendance at Code Apgar:  Our team responded to a Code Apgar call to room # 173 following NSVD, due to infant with apnea. The requesting physician was Dr. Mayo. The mother is a G1, GBS pos. ROM occurred 14 hours PTD and the fluid was thin meconium. At delivery, the baby had good tone but poor respiratory effort. The OB nursing staff in attendance gave vigorous stimulation, a few PPV breathes and a Code Apgar was called. Our team arrived at 1.5-2 minutes of life, at which time the baby was doing much better. HR >100 with good tone and strong cry. Sao2 was placed and appropriate. Ap 6 (assigned by LD)/9. I spoke with the parents in the DR, then transferred the baby to the Pediatrician's care.   David Ehrmann, MD        

## 2015-04-30 NOTE — Progress Notes (Signed)
LABOR PROGRESS NOTE  Elizabeth Briggs is a 30 y.o. G1P0000 at 4536w6d  admitted for IOL for post dates  Subjective: Patient reports that her pain is increasing and she is now ready for epidural.  Asks that we defer exam until after it is placed.  Objective: BP 126/73 mmHg  Pulse 71  Temp(Src) 99.5 F (37.5 C) (Oral)  Resp 18  Ht 5\' 2"  (1.575 m)  Wt 168 lb (76.204 kg)  BMI 30.72 kg/m2  SpO2 98%  LMP 07/17/2014 or  Filed Vitals:   04/30/15 0304 04/30/15 0328 04/30/15 0436 04/30/15 0501  BP: 113/49 111/69 114/57 126/73  Pulse: 75 75 76 71  Temp: 99.5 F (37.5 C)     TempSrc: Oral     Resp: 18 18 18 18   Height:      Weight:      SpO2:       Gen: resting in bed GU: foley bulb out Fetal monitoringBaseline: 145 bpm, Variability: Good {> 6 bpm), Accelerations: Reactive and Decelerations: Variable: mild Uterine activity irregular, q3-10   Dilation: 5 Effacement (%): 70 Cervical Position: Middle Station: -2 Presentation: Vertex Exam by:: Barrie DunkerMurayyah Johnson RN  Labs: Lab Results  Component Value Date   WBC 6.9 04/29/2015   HGB 11.8* 04/29/2015   HCT 35.3* 04/29/2015   MCV 86.3 04/29/2015   PLT 160 04/29/2015    Patient Active Problem List   Diagnosis Date Noted  . Post term pregnancy over 40 weeks 04/29/2015  . GBS (group B Streptococcus carrier), +RV culture, currently pregnant 04/03/2015  . Supervision of normal pregnancy 09/08/2014    Assessment / Plan: 30 y.o. G1P0000 at 6436w6d here for IOL.  Will plan to check cervix and rupture membranes if head well applied after epidural is placed or w/in the next hour.   Labor: IOL Fetal Wellbeing:  Cat 2 Pain Control:  Requests epidural, CBC sent Anticipated MOD:  SVD  Delynn FlavinAshly Gottschalk, DO PGY-2, [facility] 04/30/2015, 5:50 AM

## 2015-04-30 NOTE — Progress Notes (Signed)
LABOR PROGRESS NOTE  Elizabeth Briggs is a 30 y.o. G1P0000 at 5342w6d  admitted for IOL for post dates  Subjective: Patient reports that her pain is controlled. No concerns at this time.  Objective: BP 104/54 mmHg  Pulse 109  Temp(Src) 98.7 F (37.1 C) (Oral)  Resp 18  Ht 5\' 2"  (1.575 m)  Wt 168 lb (76.204 kg)  BMI 30.72 kg/m2  SpO2 98%  LMP 07/17/2014 or  Filed Vitals:   04/30/15 0007 04/30/15 0012 04/30/15 0017 04/30/15 0023  BP:      Pulse: 71 59 66 109  Temp:      TempSrc:      Resp:      Height:      Weight:      SpO2: 97% 98% 98%    Gen: resting in bed GU: foley bulb still in place Fetal monitoringBaseline: 150 bpm, Variability: Good {> 6 bpm), Accelerations: Reactive and Decelerations: Early Uterine activity irregular   Dilation: 1 Effacement (%): 70 Station: -2 Presentation: Vertex Exam by:: Zerita Boersarlene Lawson CNM  Labs: Lab Results  Component Value Date   WBC 6.9 04/29/2015   HGB 11.8* 04/29/2015   HCT 35.3* 04/29/2015   MCV 86.3 04/29/2015   PLT 160 04/29/2015    Patient Active Problem List   Diagnosis Date Noted  . Post term pregnancy over 40 weeks 04/29/2015  . GBS (group B Streptococcus carrier), +RV culture, currently pregnant 04/03/2015  . Supervision of normal pregnancy 09/08/2014    Assessment / Plan: 30 y.o. G1P0000 at 7542w6d here for IOL.  Foley bulb still in place  Labor: IOL Fetal Wellbeing:  Cat 1 Pain Control:  Fentanyl IV, epidural on request Anticipated MOD:  SVD  Delynn FlavinAshly Gottschalk, DO PGY-2, [facility] 04/30/2015, 2:30 AM

## 2015-04-30 NOTE — Progress Notes (Signed)
Elizabeth BinningGrace D Briggs is a 30 y.o. G1P0000 at 4129w0d by early ultrasound admitted for induction of labor due to Post dates.  Subjective: Pt doing well. She is feeling pressure but is not in any pain.  Objective: BP 127/86 mmHg  Pulse 81  Temp(Src) 99.2 F (37.3 C) (Oral)  Resp 20  Ht 5\' 2"  (1.575 m)  Wt 168 lb (76.204 kg)  BMI 30.72 kg/m2  SpO2 98%  LMP 07/17/2014   Total I/O In: -  Out: 750 [Urine:750]  FHT:  FHR: 150 bpm, variability: moderate,  accelerations:  Absent,  decelerations:  Absent UC:   regular, every 2-3 minutes SVE:   Dilation: 9 Effacement (%): 90 Station: 0, +1 Exam by:: Lorretta Harp. Brown RNC  Labs: Lab Results  Component Value Date   WBC 10.5 04/30/2015   HGB 12.0 04/30/2015   HCT 35.8* 04/30/2015   MCV 86.1 04/30/2015   PLT 172 04/30/2015    Assessment / Plan: Induction of labor due to postterm,  progressing well on pitocin  Labor: Progressing on Pitocin Preeclampsia:  no signs or symptoms of toxicity Fetal Wellbeing:  Category I Pain Control:  Epidural I/D:  GBS positive, on PCN Anticipated MOD:  NSVD  Jinny BlossomKaty D Mayo 04/30/2015, 5:38 PM

## 2015-04-30 NOTE — Progress Notes (Signed)
LABOR PROGRESS NOTE  Elizabeth Briggs is a 30 y.o. G1P0000 at 4435w6d  admitted for IOL for post dates  Subjective: S/p epidural.  Doing well.  Feels occ pressure in abdomen  Objective: BP 111/72 mmHg  Pulse 73  Temp(Src) 99 F (37.2 C) (Oral)  Resp 18  Ht 5\' 2"  (1.575 m)  Wt 168 lb (76.204 kg)  BMI 30.72 kg/m2  SpO2 96%  LMP 07/17/2014 or  Filed Vitals:   04/30/15 0636 04/30/15 0639 04/30/15 0641 04/30/15 0643  BP: 112/57  111/72 111/72  Pulse: 68 71 75 73  Temp:      TempSrc:      Resp:      Height:      Weight:      SpO2:  98%  96%   Gen: resting in bed  Fetal monitoringBaseline: 145 bpm, Variability: Good {> 6 bpm) and Accelerations: Reactive Uterine activity irregular, q3-8   Dilation: 6 Effacement (%): 70 Cervical Position: Middle Station: -1 Presentation: Vertex Exam by:: Dr. Nadine CountsGottschalk  Labs: Lab Results  Component Value Date   WBC 10.5 04/30/2015   HGB 12.0 04/30/2015   HCT 35.8* 04/30/2015   MCV 86.1 04/30/2015   PLT 172 04/30/2015    Patient Active Problem List   Diagnosis Date Noted  . Post term pregnancy over 40 weeks 04/29/2015  . GBS (group B Streptococcus carrier), +RV culture, currently pregnant 04/03/2015  . Supervision of normal pregnancy 09/08/2014    Assessment / Plan: 30 y.o. G1P0000 at 5635w6d here for IOL.  AROM performed, clear to light mec fluid Labor: IOL Fetal Wellbeing:  Cat 1 Pain Control:  epidural Anticipated MOD:  SVD  Delynn FlavinAshly Gottschalk, DO PGY-2, [facility] 04/30/2015, 6:50 AM

## 2015-04-30 NOTE — Anesthesia Preprocedure Evaluation (Signed)

## 2015-05-01 LAB — CBC
HCT: 34.6 % — ABNORMAL LOW (ref 36.0–46.0)
Hemoglobin: 11.7 g/dL — ABNORMAL LOW (ref 12.0–15.0)
MCH: 29 pg (ref 26.0–34.0)
MCHC: 33.8 g/dL (ref 30.0–36.0)
MCV: 85.9 fL (ref 78.0–100.0)
PLATELETS: 149 10*3/uL — AB (ref 150–400)
RBC: 4.03 MIL/uL (ref 3.87–5.11)
RDW: 14.5 % (ref 11.5–15.5)
WBC: 14 10*3/uL — AB (ref 4.0–10.5)

## 2015-05-01 NOTE — Lactation Note (Signed)
This note was copied from a baby's chart. Lactation Consultation Note Follow up visit at 21 hours of age.  Mom reports she knows baby is not latched well, but baby has been on and off for the past 3 hours and mom is tired.  Mom repositioned to football on right breast.  Baby making sucking sounds and not maintaining latch, pulls off fussy.  Noted heart shaped tip of tongue when baby cries, and bowl shaped.  LC offered gloved finger and baby is chomping and does not extend tongue well past lower gumline.  Baby pushed tongue out into lower lip.  Encouraged parents to try suck training.  Mom denies pain with latch so LC did not discuss concerns of tongue mobility. Mom reports previous good feedings with audible swallows.  LC impression is baby maybe over stimulated and uncoordinated with suck temporarily and will need further evaluation.  Mom can easily express colostrum and then LC spoon fed to baby who did not extend tongue to spoon to lick colostrum off, but tolerated well with assist.  Baby has had a large void and not stool yet.  Baby did gag on gloved finger a few times.  Baby calmed after feeding and encouraged mom to rest if she can. Mom to call for assist as needed.     Patient Name: Elizabeth Briggs ZOXWR'UToday's Date: 05/01/2015 Reason for consult: Follow-up assessment   Maternal Data    Feeding Feeding Type: Breast Fed Length of feed:  (mom reports frequent feedings over the past 3 hours)  LATCH Score/Interventions Latch: Repeated attempts needed to sustain latch, nipple held in mouth throughout feeding, stimulation needed to elicit sucking reflex. Intervention(s): Adjust position;Assist with latch;Breast massage;Breast compression  Audible Swallowing: A few with stimulation  Type of Nipple: Everted at rest and after stimulation  Comfort (Breast/Nipple): Soft / non-tender     Hold (Positioning): No assistance needed to correctly position infant at breast. Intervention(s): Breastfeeding  basics reviewed;Support Pillows;Position options;Skin to skin  LATCH Score: 8  Lactation Tools Discussed/Used     Consult Status Consult Status: Follow-up Date: 05/02/15 Follow-up type: In-patient    Jannifer RodneyShoptaw, Elizabeth Lynn 05/01/2015, 6:02 PM

## 2015-05-01 NOTE — Lactation Note (Signed)
This note was copied from a baby's chart. Lactation Consultation Note New mom took prenatal classes and BF classes. Mm had latched the baby great in football position. Mom done chin tug per self. Mom stated she had leaked colostrum since [redacted] weeks gestation. Referred to Baby and Me Book in Breastfeeding section Pg. 22-23 for position options and Proper latch demonstration.Encouraged to call for assistance if needed and to verify proper latch.Mom encouraged to do skin-to-skin. Educated about newborn behavior, I&O, cluster feeding, supply and demand. WH/LC brochure given w/resources, support groups and LC services. Patient Name: Elizabeth Dale DurhamGrace Schwalb Today's Date: 05/01/2015 Reason for consult: Initial assessment   Maternal Data Has patient been taught Hand Expression?: Yes Does the patient have breastfeeding experience prior to this delivery?: No  Feeding Feeding Type: Breast Fed Length of feed: 15 min (still BF)  LATCH Score/Interventions Latch: Grasps breast easily, tongue down, lips flanged, rhythmical sucking.  Audible Swallowing: Spontaneous and intermittent  Type of Nipple: Everted at rest and after stimulation  Comfort (Breast/Nipple): Soft / non-tender     Hold (Positioning): Assistance needed to correctly position infant at breast and maintain latch. Intervention(s): Skin to skin;Position options;Support Pillows;Breastfeeding basics reviewed  LATCH Score: 9  Lactation Tools Discussed/Used     Consult Status Consult Status: Follow-up Date: 05/02/15 Follow-up type: In-patient    Charyl DancerCARVER, Joell Buerger G 05/01/2015, 12:42 AM

## 2015-05-01 NOTE — Anesthesia Postprocedure Evaluation (Signed)
Anesthesia Post Note  Patient: Elizabeth Briggs  Procedure(s) Performed: * No procedures listed *  Patient location during evaluation: Mother Baby Anesthesia Type: Epidural Level of consciousness: awake and alert Pain management: pain level controlled Vital Signs Assessment: post-procedure vital signs reviewed and stable Respiratory status: spontaneous breathing Cardiovascular status: stable Postop Assessment: no headache, no backache, epidural receding and patient able to bend at knees Anesthetic complications: no    Last Vitals:  Filed Vitals:   05/01/15 0052 05/01/15 0440  BP: 125/72 101/71  Pulse: 99 97  Temp: 37.2 C 36.6 C  Resp: 18 18    Last Pain:  Filed Vitals:   05/01/15 0623  PainSc: 0-No pain                 Edison PaceWILKERSON,Kashayla Ungerer

## 2015-05-01 NOTE — Progress Notes (Signed)
Post Partum Day 1 Subjective:  Elizabeth Briggs is a 30 y.o. G1P1001 970w0d s/p SVD.  No acute events overnight.  Pt denies problems with ambulating, voiding or po intake.  She denies nausea or vomiting.  Pain is well controlled.  She has had flatus. She has not had bowel movement.  Lochia Moderate.  Plan for birth control is natural family planning (NFP).  Method of Feeding: Breast  Objective: Blood pressure 101/71, pulse 97, temperature 97.9 F (36.6 C), temperature source Oral, resp. rate 18, height 5\' 2"  (1.575 m), weight 168 lb (76.204 kg), last menstrual period 07/17/2014, SpO2 97 %, unknown if currently breastfeeding.  Physical Exam:  General: alert, cooperative and no distress Lochia:normal flow Chest: CTAB, normal WOB on room air Heart: RRR no m/r/g Abdomen: +BS, soft, nontender,  Uterine Fundus: firm, at level of umbilicus DVT Evaluation: No evidence of DVT seen on physical exam. Extremities: trace to +1 pedal edema   Recent Labs  04/30/15 0535 05/01/15 0519  HGB 12.0 11.7*  HCT 35.8* 34.6*    AsClemmons,Lori Grissett 05/04/2015, 6:48 PM   APP attestation:  I have seen and examined this patient; I agree with above documentation in the Resident's note.   Elizabeth Briggs is a 30 y.o. G1P1001   PE: BP 116/70 mmHg  Pulse 83  Temp(Src) 98.1 F (36.7 C) (Oral)  Resp 20  Ht 5\' 2"  (1.575 m)  Wt 168 lb (76.204 kg)  BMI 30.72 kg/m2  SpO2 97%  LMP 07/17/2014  Breastfeeding? Unknown Gen: calm comfortable, NAD Resp: normal effort, no distress Abd: gravid  ROS, labs, PMH reviewed  Plan: Discharge home tomorrow   [facility] Eye Institute PlcClemmons,Lori Grissett 05/04/2015, 6:48 PM

## 2015-05-02 MED ORDER — MEASLES, MUMPS & RUBELLA VAC ~~LOC~~ INJ
0.5000 mL | INJECTION | Freq: Once | SUBCUTANEOUS | Status: DC
  Filled 2015-05-02: qty 0.5

## 2015-05-02 MED ORDER — MEASLES, MUMPS & RUBELLA VAC ~~LOC~~ INJ: 0.5000 mL | INJECTION | Freq: Once | SUBCUTANEOUS | Status: DC

## 2015-05-02 MED ORDER — IBUPROFEN 600 MG PO TABS: 600.0000 mg | ORAL_TABLET | Freq: Four times a day (QID) | ORAL | Status: DC

## 2015-05-02 NOTE — Lactation Note (Signed)
This note was copied from a baby's chart. Lactation Consultation Note Baby had lost 5% in 26 hrs. Only had 2 voids, no stool. Baby has limited mobility of tongue. Mom has colostrum and great everted nipples. D/t weight loss and tongue, LC thinks could be d/t transfer from low breast. Fitted mom w/#20 NS and inserted Alimentum for latching and supplement. Hand expressed 1ml colostrum. Moms breast feel heavy/filling, massage breast and could only get 1 ml colostrum. Asked mom to get MD to asses baby's mouth and suck. Baby keeps loosing seal at breast and on NS, and smacks occasionally. Baby has been BF for long periods. See colostrum on mouth. LC concerned about low transfer and low output.  Patient Name: Girl Dale DurhamGrace Salser ZOXWR'UToday's Date: 05/02/2015 Reason for consult: Follow-up assessment;Infant weight loss   Maternal Data    Feeding Feeding Type: Formula Length of feed: 15 min (still BF)  LATCH Score/Interventions Latch: Grasps breast easily, tongue down, lips flanged, rhythmical sucking. Intervention(s): Adjust position;Assist with latch;Breast massage;Breast compression  Audible Swallowing: A few with stimulation Intervention(s): Skin to skin;Hand expression;Alternate breast massage  Type of Nipple: Everted at rest and after stimulation  Comfort (Breast/Nipple): Soft / non-tender     Hold (Positioning): Assistance needed to correctly position infant at breast and maintain latch. Intervention(s): Skin to skin;Position options;Support Pillows;Breastfeeding basics reviewed  LATCH Score: 8  Lactation Tools Discussed/Used Tools: Nipple Shields Nipple shield size: 20   Consult Status Consult Status: Follow-up Date: 05/02/15 Follow-up type: In-patient    Charyl DancerCARVER, Kaled Allende G 05/02/2015, 3:38 AM

## 2015-05-02 NOTE — Discharge Summary (Signed)
OB Discharge Summary     Patient Name: Elizabeth Briggs DOB: 04-23-1985 MRN: [id number]  Date of admission: 04/29/2015 Delivering MD: Raliegh Ip   Date of discharge: 05/02/2015  Admitting diagnosis: induction Intrauterine pregnancy: [redacted]w[redacted]d     Secondary diagnosis:  Principal Problem:   Post term pregnancy over 40 weeks Active Problems:   SVD (spontaneous vaginal delivery)  Additional problems: none     Discharge diagnosis: Term Pregnancy Delivered                                                                                                Post partum procedures: none  Augmentation: Pitocin, Cytotec and Foley Balloon  Complications: None  Hospital course:  Induction of Labor With Vaginal Delivery   30 y.o. yo G1P1001 at [redacted]w[redacted]d was admitted to the hospital 04/29/2015 for induction of labor.  Indication for induction: Postdates.  Patient had an uncomplicated labor course as follows: Membrane Rupture Time/Date: 6:44 AM ,04/30/2015   Intrapartum Procedures: Episiotomy: None [1]                                         Lacerations:  Labial [10]  Patient had delivery of a Viable infant.  Information for the patient's newborn:  Mikhala, Kenan [[id number]]  Delivery Method: Vag-Spont    04/30/2015  Details of delivery can be found in separate delivery note.  Patient had a routine postpartum course. Patient is discharged home 05/02/2015.   Physical exam  Filed Vitals:   05/01/15 0052 05/01/15 0440 05/01/15 1811 05/02/15 0544  BP: 125/72 101/71 108/60 116/70  Pulse: 99 97 76 83  Temp: 98.9 F (37.2 C) 97.9 F (36.6 C) 98 F (36.7 C) 98.1 F (36.7 C)  TempSrc: Oral Oral Oral Oral  Resp: Height:      Weight:      SpO2: 97% 97%     General: alert, cooperative and no distress Lochia: appropriate Uterine Fundus: firm Incision: N/A DVT Evaluation: No evidence of DVT seen on physical exam. Negative Homan's sign. No cords or calf tenderness. No significant  calf/ankle edema. Labs: Lab Results  Component Value Date   WBC 14.0* 05/01/2015   HGB 11.7* 05/01/2015   HCT 34.6* 05/01/2015   MCV 85.9 05/01/2015   PLT 149* 05/01/2015   No flowsheet data found.  Discharge instruction: per After Visit Summary and "Baby and Me Booklet".  After visit meds:    Medication List    TAKE these medications        Breast Pump Misc  Dispense one breast pump for patient     ibuprofen 600 MG tablet  Commonly known as:  ADVIL,MOTRIN  Take 1 tablet (600 mg total) by mouth every 6 (six) hours.     multivitamin-prenatal 27-0.8 MG Tabs tablet  Take 1 tablet by mouth daily at 12 noon.     omeprazole 20 MG capsule  Commonly known as:  PRILOSEC  Take 1 capsule (20 mg  total) by mouth daily.        Diet: routine diet  Activity: Advance as tolerated. Pelvic rest for 6 weeks.   Outpatient follow up:6 weeks Follow up Appt:No future appointments. Follow up Visit:No Follow-up on file.  Postpartum contraception: withdrawal method  Newborn Data: Live born female  Birth Weight: 7 lb 6.9 oz (3370 g) APGAR: 6, 9  Baby Feeding: Breast Disposition:rooming in   05/02/2015 Delynn FlavinAshly Gottschalk, DO   OB fellow attestation I have seen and examined this patient and agree with above documentation in the resident's note.   Ronne BinningGrace D Bratton is a 30 y.o. G1P1001 s/p NSVD.   Pain is well controlled.  Plan for birth control is IUD.  Method of Feeding: breast  PE:  BP 116/70 mmHg  Pulse 83  Temp(Src) 98.1 F (36.7 C) (Oral)  Resp 20  Ht 5\' 2"  (1.575 m)  Wt 168 lb (76.204 kg)  BMI 30.72 kg/m2  SpO2 97%  LMP 07/17/2014  Breastfeeding? Unknown Gen: well appearing Heart: reg rate Lungs: normal WOB Fundus firm Ext: soft, no pain, no edema   Recent Labs  04/30/15 0535 05/01/15 0519  HGB 12.0 11.7*  HCT 35.8* 34.6*   Plan: discharge today - postpartum care discussed - f/u clinic in 6 weeks for postpartum visit   Federico FlakeKimberly Niles Princess Karnes, MD 9:51  AM

## 2015-05-02 NOTE — Discharge Instructions (Signed)
Nothing per vagina for 6 weeks.  No intercourse, no tampons, no douching. Postpartum Care After Vaginal Delivery After you deliver your newborn (postpartum period), the usual stay in the hospital is 24-72 hours. If there were problems with your labor or delivery, or if you have other medical problems, you might be in the hospital longer.  While you are in the hospital, you will receive help and instructions on how to care for yourself and your newborn during the postpartum period.  While you are in the hospital:  Be sure to tell your nurses if you have pain or discomfort, as well as where you feel the pain and what makes the pain worse.  If you had an incision made near your vagina (episiotomy) or if you had some tearing during delivery, the nurses may put ice packs on your episiotomy or tear. The ice packs may help to reduce the pain and swelling.  If you are breastfeeding, you may feel uncomfortable contractions of your uterus for a couple of weeks. This is normal. The contractions help your uterus get back to normal size.  It is normal to have some bleeding after delivery.  For the first 1-3 days after delivery, the flow is red and the amount may be similar to a period.  It is common for the flow to start and stop.  In the first few days, you may pass some small clots. Let your nurses know if you begin to pass large clots or your flow increases.  Do not  flush blood clots down the toilet before having the nurse look at them.  During the next 3-10 days after delivery, your flow should become more watery and pink or brown-tinged in color.  Ten to fourteen days after delivery, your flow should be a small amount of yellowish-white discharge.  The amount of your flow will decrease over the first few weeks after delivery. Your flow may stop in 6-8 weeks. Most women have had their flow stop by 12 weeks after delivery.  You should change your sanitary pads frequently.  Wash your hands  thoroughly with soap and water for at least 20 seconds after changing pads, using the toilet, or before holding or feeding your newborn.  You should feel like you need to empty your bladder within the first 6-8 hours after delivery.  In case you become weak, lightheaded, or faint, call your nurse before you get out of bed for the first time and before you take a shower for the first time.  Within the first few days after delivery, your breasts may begin to feel tender and full. This is called engorgement. Breast tenderness usually goes away within 48-72 hours after engorgement occurs. You may also notice milk leaking from your breasts. If you are not breastfeeding, do not stimulate your breasts. Breast stimulation can make your breasts produce more milk.  Spending as much time as possible with your newborn is very important. During this time, you and your newborn can feel close and get to know each other. Having your newborn stay in your room (rooming in) will help to strengthen the bond with your newborn. It will give you time to get to know your newborn and become comfortable caring for your newborn.  Your hormones change after delivery. Sometimes the hormone changes can temporarily cause you to feel sad or tearful. These feelings should not last more than a few days. If these feelings last longer than that, you should talk to your caregiver.  If desired, talk to your caregiver about methods of family planning or contraception.  Talk to your caregiver about immunizations. Your caregiver may want you to have the following immunizations before leaving the hospital:  Tetanus, diphtheria, and pertussis (Tdap) or tetanus and diphtheria (Td) immunization. It is very important that you and your family (including grandparents) or others caring for your newborn are up-to-date with the Tdap or Td immunizations. The Tdap or Td immunization can help protect your newborn from getting ill.  Rubella  immunization.  Varicella (chickenpox) immunization.  Influenza immunization. You should receive this annual immunization if you did not receive the immunization during your pregnancy.   This information is not intended to replace advice given to you by your health care provider. Make sure you discuss any questions you have with your health care provider.   Document Released: 11/10/2006 Document Revised: 10/08/2011 Document Reviewed: 09/10/2011 Elsevier Interactive Patient Education [employer]2016 [employer] Inc[employer]

## 2015-05-02 NOTE — Progress Notes (Signed)
Post Partum Day 2 Subjective:  Elizabeth Briggs is a 30 y.o. G1P1001 7449w0d s/p SVD.  No acute events overnight.  Pt denies problems with ambulating, voiding or po intake.  She denies nausea or vomiting.  Pain is well controlled.  She has had flatus. She has not had bowel movement.  Lochia Small.  Plan for birth control is withdrawal method.  Method of Feeding: breast  Objective: Blood pressure 116/70, pulse 83, temperature 98.1 F (36.7 C), temperature source Oral, resp. rate 20, height 5\' 2"  (1.575 m), weight 168 lb (76.204 kg), last menstrual period 07/17/2014, SpO2 97 %, unknown if currently breastfeeding.  Physical Exam:  General: alert, cooperative and no distress Lochia:normal flow Chest: CTAB Heart: RRR no m/r/g Abdomen: +BS, soft, nontender,  Uterine Fundus: firm DVT Evaluation: No evidence of DVT seen on physical exam. Extremities: no edema   Recent Labs  04/30/15 0535 05/01/15 0519  HGB 12.0 11.7*  HCT 35.8* 34.6*    Assessment/Plan:  ASSESSMENT: Elizabeth BinningGrace D Briggs is a 30 y.o. G1P1001 3049w0d s/p SVD  Plan for discharge tomorrow, Breastfeeding and Lactation consult   LOS: 3 days   Delynn FlavinAshly Gottschalk, DO 05/02/2015, 7:23 AM   OB fellow attestation:  I have seen and examined this patient; I agree with above documentation in the resident's note.   Federico FlakeKimberly Niles Newton, MD 9:43 AM

## 2015-05-03 ENCOUNTER — Ambulatory Visit: Payer: Self-pay

## 2015-05-03 NOTE — Lactation Note (Addendum)
This note was copied from a baby's chart. Lactation Consultation Note  Patient Name: Elizabeth Briggs XBJYN'WToday's Date: 05/03/2015 Reason for consult: Follow-up assessment    With this mom of a term baby, now 5761 hours old. The mom is aware that the baby may have some tongue mobility problems - she has a lip frenulum that goes to the gum line, and a tongue that cups with crying, and lefts at the tip with extension. Mom is still putting baby to breast, supplementing with Alimentum formula, by bottle, and then pumping and feeding EBm with next feed. Pediatrician, Dr. Waynette ButteryGreer in the room during my consult, and aware of the possible tongue tie, and that resources were given to mom. Mom made an o/p appointment for 4/12, and knows to call for questions/concerns. The baby did pass her first meconium stool, when Dr. Remonia RichterGrier stimulated the baby to stool  with leg exercises.    Maternal Data    Feeding Feeding Type: Breast Fed Length of feed: 10 min  LATCH Score/Interventions                      Lactation Tools Discussed/Used     Consult Status Consult Status: Complete Date: 05/09/15 Follow-up type: Out-patient    Elizabeth Briggs, Elizabeth Briggs 05/03/2015, 10:04 AM

## 2015-05-09 ENCOUNTER — Ambulatory Visit (HOSPITAL_COMMUNITY)
Admission: RE | Admit: 2015-05-09 | Discharge: 2015-05-09 | Disposition: A | Discharge status: Home / Self Care | Payer: BLUE CROSS/BLUE SHIELD | Source: Ambulatory Visit | Attending: Obstetrics & Gynecology | Admitting: Obstetrics & Gynecology

## 2015-05-09 NOTE — Lactation Note (Signed)
Lactation Consult  Mother's reason for visit:  F/U from birth  Visit Type: feeding assessment  Appointment Notes:  Posterior tongue tie, mom pumped and bottle feeding and also breast feeding . Confirmed appt. 4/12  Consult:  Initial Lactation Consultant:  Elizabeth Briggs  ________________________________________________________________________ Baby's Name: Elizabeth Briggs Date of Birth: 04/30/2016 Pediatrician: Dr. Gregary Signs [facility] - [place]  Gender: female Gestational Age: [redacted]w[redacted]d (At Birth) Birth Weight: 7 lb 6.9 oz (3370 g) Weight at Discharge: Weight: 7 lb 2.3 oz (3240 g)Date of Discharge: 05/03/2015 [facility] Weights   04/30/15 2036 05/01/15 2311 05/03/15 0131  Weight: 7 lb 6.9 oz (3370 g) 7 lb 1.4 oz (3215 g) 7 lb 2.3 oz (3240 g)   Last weight taken from location outside of [facility]:7-13 oz Location:Pediatrician's office Weight today:3594 g , 7-14.8 oz   _____________________________________________________________________  Mother's Name: Elizabeth Briggs Type of delivery:  Vaginal Delivery  Breastfeeding Experience: 1st baby  - per mom mostly pumping and bottle feeding expressed milk  Maternal Medical Conditions:  None  Maternal Medications:  PNV , Motrin , Prolec   ________________________________________________________________________  Breastfeeding History (Post Discharge) - per mom   Frequency of breastfeeding:  2-3 times a day  Duration of feeding:  30 mins total ( 15 mins each breast ) and then finish feeding with a bottle   Supplementing: Supplement with EBM or Similac Alimentum when needed -  Or instead of breast feeding at night feed a bottle ( medela nipple ) and pump for 15 mins So per mom can get better rest at night.  Per mom the most volume Elizabeth Briggs has taken form a bottle = 3 1/2 oz   Pumping: with w DEBP Medela - every 2 12/ -3 hours for 15 -20 mins - with 120 -160 ml yield   Infant Intake and Output  Assessment  Voids:  10  in 24 hrs.  Color:  Clear yellow Stools:  6  in 24 hrs.  Color:  Yellow  ________________________________________________________________________  Maternal Breast Assessment  Breast:  Full Nipple:  Erect Pain level:  0 Pain interventions:  Expressed breast milk  _______________________________________________________________________ Feeding Assessment/Evaluation -  Per mom Elizabeth Briggs ate 3 1/2 oz at 1030 am and I pumped at 1100 for 15 mins with 95 ml yield  Initial feeding assessment:  Infant's oral assessment:  Variance - LC noted @ oral exam with a gloved finger , high palate, short labial frenulum ( upper lip stretches with exam and when latched , but easily flips upper lip under when latched and LC having to flip to flmaged position and easy chin down ward. Short anterior frenulum, and posterior frenulum ( when baby sucks on a gloved finger intermittently humps the back of the her tongue)   Positioning:  Football Left breast  LATCH documentation:  Latch:  2 = Grasps breast easily, tongue down, lips flanged, rhythmical sucking.  Audible swallowing:  2 = Spontaneous and intermittent  Type of nipple:  2 = Everted at rest and after stimulation  Comfort (Breast/Nipple):  1 = Filling, red/small blisters or bruises, mild/mod discomfort  Hold (Positioning):  1 = Assistance needed to correctly position infant at breast and maintain latch  LATCH score:  8  Attached assessment:  Shallow at 1st and poor seal on the breast , re - latch with improved depth and flipping upper lips to flanged position and easing chin and improved latch,  But noted in the 15 mins feeding baby to seal  to be broken, and easily latched, also rubbing intermittent rubbing noise noted. Per mom comfortable with latch also noted the noise and  Has noted it at home with latches.   Lips flanged:  No.  Lips untucked:  No.  Suck assessment:  Nutritive and Nonnutritive ( more nutritive with breast  compressions - LC encouraged )   Tools:  None  Instructed on use and cleaning of tool:  No.  Pre-feed weight:  3594 g , 7-14.8 oz  Post-feed weight:  3642 g , 8-0.5 oz  Amount transferred: 48 ml  Amount supplemented:  None   Additional Feeding Assessment -   Infant's oral assessment:  Variance see note above   Positioning:  Football Right breast  LATCH documentation:  Latch:  2 = Grasps breast easily, tongue down, lips flanged, rhythmical sucking.  Audible swallowing:  2 = Spontaneous and intermittent  Type of nipple:  2 = Everted at rest and after stimulation  Comfort (Breast/Nipple):  1 = Filling, red/small blisters or bruises, mild/mod discomfort  Hold (Positioning):  1 = Assistance needed to correctly position infant at breast and maintain latch  LATCH score: 8   Attached assessment:  Shallow at 1st ( worked on depth , latched deeper compared to the left breast   Lips flanged:  No. - upper lip rolled under ( LC showed mom how to flip lip to a flanged position to cover more areola)   Lips untucked:  No. - eased chin down gently to flange lips open  Intermittently noted baby to ease back from a deep latch and rubbing noise noted ( LC suspects it to be the posterior portion of the tongue humping - like it did with oral exam)   and the latch seal would be broken and mom had to re - latch.  Feeding pattern was more consistent on the right breast compared to the left breast.    Suck assessment:  Nutritive and Nonnutritive - more nutritive with breast compressions.   Tools:  None  Instructed on use and cleaning of tool:  No.   Large wet diaper changed and baby re-weighed ( see below )   Pre-feed weight:  3616 g , 7-15.6 oz  Post-feed weight:  3644 g , 8-0.6 oz  Amount transferred: 28 ml  Amount supplemented:  none   Total amount pumped post feed:  Mom fed on both breast and didn't post pump   Total amount transferred: 76 ml  Total supplement given: none   Lactation  Impression:  Baby Elizabeth Briggs transferred 76 ml total off both breast @ 9 days out  ( good volume for her age )  Mom is very motivated to breast feed and pump.  Breast feeding only 2-3 times day , supplementing at the feedings if needed  And pumping at least 8 times day  [facility] concerns is the Baby "Tongue mobility issues", difficulty sustaining the consistent depth at the breast  In order to assist mom to protect her milk supply. ( discussed this with mom) ( see oral variance above )  Mom's goal is to breast feed and provide breast milk for her baby.  @ 9 days out baby should be latching better - LC suspects it is tongue issues.  Baby satisfied after feeding both breast and wasn't supplemented - therefore LC was unable to assess how baby feeds from a bottle.   Lactation Plan of Care:  Praised mom for her efforts breast feeding and pumping. ( multi-tasking)  Goal -  protect established milk supply  Consider having 'Elizabeth Briggs " - assessed by oral specialist for frenulum challenges ( see oral variance note above )  ( see hand out for referrals for oral specialist provided from I/P LC  Option #1 - feed both breast - supplement if needed - post  pump both breast 10 -20 mins  Option #2 - feed 1st breast 20 mins - supplement after wards - post pump the other breast Elizabeth Briggs didn't feed on .  Option #3 - AS the day  progresses - for example - evening - you may only breast feed both sides - supplement of needed - and skip pumping due to your breast not being as full.  When feeding Elizabeth Briggs form a bottle - challenge  her to open wide and make sure her lips are flanged at the base of the nipple. ( checked for flanged lips )

## 2015-06-11 ENCOUNTER — Ambulatory Visit (INDEPENDENT_AMBULATORY_CARE_PROVIDER_SITE_OTHER): Payer: BLUE CROSS/BLUE SHIELD | Admitting: Obstetrics & Gynecology

## 2015-06-11 ENCOUNTER — Encounter: Payer: Self-pay | Admitting: Obstetrics & Gynecology

## 2015-06-11 MED ORDER — FLUCONAZOLE 150 MG PO TABS: 150.0000 mg | ORAL_TABLET | Freq: Once | ORAL | Status: DC

## 2015-06-11 NOTE — Progress Notes (Signed)
  Subjective:     Elizabeth Briggs is a 30 y.o. MW 77P1 female who presents for a postpartum visit. She is 6 weeks postpartum following a spontaneous vaginal delivery. I have fully reviewed the prenatal and intrapartum course. The delivery was at 41 gestational weeks. Outcome: spontaneous vaginal delivery. Anesthesia: epidural. Postpartum course has been normal. Baby's course has been normal . Baby is feeding by breast. Bleeding no bleeding. Bowel function is normal. Bladder function is normal. Patient is not sexually active. Contraception method is rhythm method. Postpartum depression screening: negative.  The following portions of the patient's history were reviewed and updated as appropriate: allergies, current medications, past family history, past medical history, past social history, past surgical history and problem list.  Review of Systems Pertinent items are noted in HPI.   Objective:    BP 113/77 mmHg  Pulse 70  Ht 5\' 2"  (1.575 m)  Wt 140 lb (63.504 kg)  BMI 25.60 kg/m2  General:  alert   Breasts:  inspection negative, no nipple discharge or bleeding, no masses or nodularity palpable  Lungs: clear to auscultation bilaterally  Heart:  regular rate and rhythm, S1, S2 normal, no murmur, click, rub or gallop  Abdomen: soft, non-tender; bowel sounds normal; no masses,  no organomegaly   Vulva:  normal  Vagina: normal vagina  Cervix:  not evaluated  Corpus: not examined  Adnexa:  not evaluated  Rectal Exam: Not performed.        Assessment:     Normal postpartum exam. Pap smear not done at today's visit.   Plan:    1. Contraception: rhythm method 2. Annual in the fall 3. Follow up in: 6 months or as needed.

## 2015-07-27 ENCOUNTER — Ambulatory Visit: Payer: BLUE CROSS/BLUE SHIELD | Admitting: Physician Assistant

## 2015-08-01 ENCOUNTER — Ambulatory Visit (INDEPENDENT_AMBULATORY_CARE_PROVIDER_SITE_OTHER): Payer: BLUE CROSS/BLUE SHIELD | Admitting: Physician Assistant

## 2015-08-01 ENCOUNTER — Encounter: Payer: Self-pay | Admitting: Physician Assistant

## 2015-08-01 VITALS — BP 119/77 | HR 78 | Ht 62.0 in | Wt 133.0 lb

## 2015-08-01 DIAGNOSIS — J329 Chronic sinusitis, unspecified: Secondary | ICD-10-CM | POA: Diagnosis not present

## 2015-08-01 DIAGNOSIS — B9689 Other specified bacterial agents as the cause of diseases classified elsewhere: Secondary | ICD-10-CM

## 2015-08-01 DIAGNOSIS — A499 Bacterial infection, unspecified: Secondary | ICD-10-CM | POA: Diagnosis not present

## 2015-08-01 MED ORDER — AMOXICILLIN-POT CLAVULANATE 875-125 MG PO TABS: 1.0000 | ORAL_TABLET | Freq: Two times a day (BID) | ORAL | Status: DC

## 2015-08-01 NOTE — Progress Notes (Addendum)
   Subjective:    Patient ID: Elizabeth Briggs, female    DOB: Sep 18, 1985, 30 y.o.   MRN: [id number]  HPI  Pt is a 30 yo female who presents to the clinic to establish care.   .. Active Ambulatory Problems    Diagnosis Date Noted  . Supervision of normal pregnancy 09/08/2014  . GBS (group B Streptococcus carrier), +RV culture, currently pregnant 04/03/2015  . Post term pregnancy over 40 weeks 04/29/2015  . SVD (spontaneous vaginal delivery) 05/02/2015   Resolved Ambulatory Problems    Diagnosis Date Noted  . Well woman exam with routine gynecological exam 01/23/2014  . Family planning, natural methods to avoid pregnancy 01/23/2014   Past Medical History  Diagnosis Date  . Abnormal Pap smear of cervix 2004   .Marland Kitchen. Family History  Problem Relation Age of Onset  . Hyperlipidemia Mother   . Diabetes type I Brother   . Prostate cancer Father [id number]  . Endometriosis Mother    .Marland Kitchen. Social History   Social History  . Marital Status: Married    Spouse Name: N/A  . Number of Children: N/A  . Years of Education: N/A   Occupational History  . Not on file.   Social History Main Topics  . Smoking status: Former Smoker    Quit date: 09/08/2013  . Smokeless tobacco: Never Used  . Alcohol Use: No     Comment: Occasional  . Drug Use: No  . Sexual Activity:    Partners: Male    Birth Control/ Protection: None   Other Topics Concern  . Not on file   Social History Narrative   Pt has had bilateral ear pressure, facial pain, sinus drainage, ST, dry cough for 2 weeks. She is breastfeeding her 713 month old baby girl and did not want to take a lot of OTC abx. Zyrtec has not helped. No fever, chills, body aches. No wheezing or SOB. Green and yellow rhinorhea.      Review of Systems  All other systems reviewed and are negative.      Objective:   Physical Exam  Constitutional: She is oriented to person, place, and time. She appears well-developed and well-nourished.  HENT:  Head:  Normocephalic and atraumatic.  Right Ear: External ear normal.  Left Ear: External ear normal.  Nose: Nose normal.  Mouth/Throat: Oropharynx is clear and moist. No oropharyngeal exudate.  TM's clear.  orpharynx erythematous.  Tenderness over maxillary and frontal sinuses.  Rhinorrhea with red and swollen turbinates.   Eyes: Conjunctivae are normal. Right eye exhibits no discharge. Left eye exhibits no discharge.  Neck: Normal range of motion. Neck supple. No thyromegaly present.  Cardiovascular: Normal rate, regular rhythm and normal heart sounds.   Pulmonary/Chest: Effort normal and breath sounds normal. She has no wheezes.  Lymphadenopathy:    She has no cervical adenopathy.  Neurological: She is alert and oriented to person, place, and time.  Psychiatric: She has a normal mood and affect. Her behavior is normal.          Assessment & Plan:  Bacterial sinusitis- augmentin given for 10 days. Encouraged flonase 2 sprays each nostril. Symptomatic care given. Follow up if not improving. Honey for cough.

## 2015-08-01 NOTE — Patient Instructions (Signed)

## 2015-10-12 ENCOUNTER — Encounter: Payer: Self-pay | Admitting: Physician Assistant

## 2015-10-12 ENCOUNTER — Ambulatory Visit (INDEPENDENT_AMBULATORY_CARE_PROVIDER_SITE_OTHER): Payer: BLUE CROSS/BLUE SHIELD | Admitting: Physician Assistant

## 2015-10-12 VITALS — BP 105/69 | HR 102 | Ht 62.0 in | Wt 133.0 lb

## 2015-10-12 DIAGNOSIS — J0100 Acute maxillary sinusitis, unspecified: Secondary | ICD-10-CM

## 2015-10-12 DIAGNOSIS — J3089 Other allergic rhinitis: Secondary | ICD-10-CM | POA: Insufficient documentation

## 2015-10-12 DIAGNOSIS — J302 Other seasonal allergic rhinitis: Secondary | ICD-10-CM | POA: Insufficient documentation

## 2015-10-12 MED ORDER — AMOXICILLIN-POT CLAVULANATE 875-125 MG PO TABS: 1.0000 | ORAL_TABLET | Freq: Two times a day (BID) | ORAL | 0 refills | Status: DC

## 2015-10-12 NOTE — Patient Instructions (Signed)

## 2015-10-12 NOTE — Progress Notes (Addendum)
Subjective:     Patient ID: Elizabeth Briggs, female   DOB: 12-03-1985, 30 y.o.   MRN: [id number]  HPI Patient is a 30 y.o. Caucasian female presenting today with complaints of sinus pressure. The patient notes that she experiences these symptoms around this time every year. However, the patient recently had a child 5 months ago and is currently breastfeed and was told not to take over-the-counter antihistamines such as allegra or zyrtec. The patient states that she is currently having headaches everyday for the past 2 weeks and feels fluid and drainage in the back of her throat. She describes the headaches as a 5/10 with 10 being the worst pain she has ever felt. The patient notes that as the day progresses she is having increased muscle and bone aches. She states that her congestion is worst around her ears and that she occasionally hears a popping sound. The patient denies fever, appetite changes, ear discharge, shortness of breath, or chest pain.   Review of Systems  Constitutional: Positive for fatigue. Negative for activity change, appetite change, chills, diaphoresis, fever and unexpected weight change.  HENT: Positive for congestion, ear pain, postnasal drip, sinus pressure and sneezing. Negative for ear discharge, hearing loss, rhinorrhea and sore throat.   Eyes: Negative for pain, discharge, redness and itching.  Respiratory: Positive for cough. Negative for chest tightness, shortness of breath and wheezing.   Cardiovascular: Negative for chest pain and palpitations.  Gastrointestinal: Negative.   Genitourinary: Negative.   Musculoskeletal: Positive for arthralgias, back pain, myalgias and neck pain.  Neurological: Positive for headaches. Negative for dizziness, weakness and light-headedness.  Psychiatric/Behavioral: Negative for behavioral problems. The patient is not nervous/anxious.       Objective:   Physical Exam  Constitutional: She is oriented to person, place, and time. She appears  well-developed and well-nourished. No distress.  HENT:  Head: Normocephalic and atraumatic.    Right Ear: External ear normal.  Left Ear: External ear normal.  Nose: Nose normal.  Mouth/Throat: Oropharynx is clear and moist. No oropharyngeal exudate.  Eyes: Conjunctivae and EOM are normal. Pupils are equal, round, and reactive to light. Right eye exhibits no discharge. Left eye exhibits no discharge. No scleral icterus.  Neck: Normal range of motion. Neck supple. No JVD present. No tracheal deviation present. No thyromegaly present.  Cardiovascular: Normal rate, regular rhythm, normal heart sounds and intact distal pulses.  Exam reveals no gallop and no friction rub.   No murmur heard. Pulmonary/Chest: No stridor. No respiratory distress. She has no wheezes. She has no rales. She exhibits no tenderness.  Abdominal: Soft. Bowel sounds are normal. She exhibits no distension and no mass. There is no tenderness. There is no rebound and no guarding.  Lymphadenopathy:    She has no cervical adenopathy.  Neurological: She is alert and oriented to person, place, and time. She has normal reflexes. She displays normal reflexes. No cranial nerve deficit. She exhibits normal muscle tone. Coordination normal.  Skin: Skin is warm and dry. No rash noted. She is not diaphoretic. No erythema. No pallor.  Psychiatric: She has a normal mood and affect. Her behavior is normal. Judgment and thought content normal.      Assessment:        Diagnoses and all orders for this visit:  Acute maxillary sinusitis, recurrence not specified  Patient is a currently breast-feeding mother  Other seasonal allergic rhinitis  Other orders -     amoxicillin-clavulanate (AUGMENTIN) 875-125 MG tablet; Take 1 tablet  by mouth 2 (two) times daily. For 10 days.    Plan:     1. Acute maxillary sinusitis - Discussed with patient that her symptoms are likely secondary to a sinus infection. Patient educated on appropriate  symptomatic treatment at home including humidifier, tylenol, and zyrtec. Informed patient that zyrtec is alright to take when breastfeeding but to limit her use somewhat. Patient to start of Augmentin 875-125mg   Tablets twice daily for 10 days. Patient to return-to-clinic if symptoms persist or worsen.

## 2015-12-31 ENCOUNTER — Ambulatory Visit: Payer: Self-pay

## 2016-01-09 ENCOUNTER — Other Ambulatory Visit (INDEPENDENT_AMBULATORY_CARE_PROVIDER_SITE_OTHER): Payer: BLUE CROSS/BLUE SHIELD

## 2016-01-09 VITALS — Temp 98.4°F

## 2016-01-09 DIAGNOSIS — N390 Urinary tract infection, site not specified: Secondary | ICD-10-CM | POA: Diagnosis not present

## 2016-01-09 LAB — POCT URINALYSIS DIPSTICK
Bilirubin, UA: NEGATIVE
GLUCOSE UA: NEGATIVE
Ketones, UA: NEGATIVE
Nitrite, UA: POSITIVE
UROBILINOGEN UA: NEGATIVE
pH, UA: 7

## 2016-01-09 MED ORDER — SULFAMETHOXAZOLE-TRIMETHOPRIM 800-160 MG PO TABS: 1.0000 | ORAL_TABLET | Freq: Two times a day (BID) | ORAL | 0 refills | Status: DC

## 2016-01-09 NOTE — Progress Notes (Signed)
Pt here for nurse only visit with c/o's burning as she finishes voiding and still has the urge to go.  Urinalysis is positive.  RX for Septra DS sent to [facility].  Urine culture is sent to lab

## 2016-01-11 LAB — CULTURE, URINE COMPREHENSIVE

## 2016-01-14 ENCOUNTER — Telehealth: Payer: Self-pay | Admitting: *Deleted

## 2016-01-14 NOTE — Telephone Encounter (Signed)
Pt notified of urine culture results.  She was treated with Bactrim DS which is appropriate per the sensitivity.

## 2016-01-25 ENCOUNTER — Encounter: Payer: Self-pay | Admitting: Advanced Practice Midwife

## 2016-01-25 ENCOUNTER — Ambulatory Visit (INDEPENDENT_AMBULATORY_CARE_PROVIDER_SITE_OTHER): Payer: BLUE CROSS/BLUE SHIELD | Admitting: Advanced Practice Midwife

## 2016-01-25 VITALS — BP 104/71 | HR 71 | Resp 16 | Ht 62.0 in | Wt 133.0 lb

## 2016-01-25 DIAGNOSIS — Z1151 Encounter for screening for human papillomavirus (HPV): Secondary | ICD-10-CM | POA: Diagnosis not present

## 2016-01-25 DIAGNOSIS — Z01419 Encounter for gynecological examination (general) (routine) without abnormal findings: Secondary | ICD-10-CM

## 2016-01-25 DIAGNOSIS — Z124 Encounter for screening for malignant neoplasm of cervix: Secondary | ICD-10-CM

## 2016-01-25 DIAGNOSIS — Z0000 Encounter for general adult medical examination without abnormal findings: Secondary | ICD-10-CM

## 2016-01-25 NOTE — Patient Instructions (Signed)

## 2016-01-25 NOTE — Progress Notes (Signed)
GYNECOLOGY ANNUAL PREVENTATIVE CARE ENCOUNTER NOTE  Subjective:   Elizabeth Briggs is a 30 y.o. 761P1001 female here for a routine annual gynecologic exam.  Current complaints: none.   Denies abnormal vaginal bleeding, discharge, pelvic pain, problems with intercourse or other gynecologic concerns.    Gynecologic History Patient's last menstrual period was 01/16/2016. Contraception: rhythm method Last Pap: 2015. Results were: normal Last mammogram: none.   Obstetric History OB History  Gravida Para Term Preterm AB Living  1 1 1  0 0 1  SAB TAB Ectopic Multiple Live Births  0 0 0 0 1    # Outcome Date GA Lbr Len/2nd Weight Sex Delivery Anes PTL Lv  1 Term 04/30/15 5152w0d 14:46 / 02:22 7 lb 6.9 oz (3.37 kg) F Vag-Spont EPI  LIV      Past Medical History:  Diagnosis Date  . Abnormal Pap smear of cervix 2004    Past Surgical History:  Procedure Laterality Date  . COLPOSCOPY    . MOUTH SURGERY      Current Outpatient Prescriptions on File Prior to Visit  Medication Sig Dispense Refill  . Prenatal Vit-Fe Fumarate-FA (MULTIVITAMIN-PRENATAL) 27-0.8 MG TABS tablet Take 1 tablet by mouth daily at 12 noon.      No current facility-administered medications on file prior to visit.     No Known Allergies  Social History   Social History  . Marital status: Married    Spouse name: N/A  . Number of children: N/A  . Years of education: N/A   Occupational History  . Not on file.   Social History Main Topics  . Smoking status: Former Smoker    Quit date: 09/08/2013  . Smokeless tobacco: Never Used  . Alcohol use No     Comment: Occasional  . Drug use: No  . Sexual activity: Yes    Partners: Male    Birth control/ protection: None   Other Topics Concern  . Not on file   Social History Narrative  . No narrative on file    Family History  Problem Relation Age of Onset  . Hyperlipidemia Mother   . Endometriosis Mother   . Diabetes type I Brother   . Prostate cancer  Father [id number]    The following portions of the patient's history were reviewed and updated as appropriate: allergies, current medications, past family history, past medical history, past social history, past surgical history and problem list.  Review of Systems Pertinent items are noted in HPI.   Objective:  BP 104/71   Pulse 71   Resp 16   Ht 5\' 2"  (1.575 m)   Wt 133 lb (60.3 kg)   LMP 01/16/2016   BMI 24.33 kg/m  CONSTITUTIONAL: Well-developed, well-nourished female in no acute distress.  HENT:  Normocephalic EYES:  No scleral icterus.  NECK: Normal range of motion, supple, no masses.  Normal thyroid.  SKIN: Skin is warm and dry. No rash noted. Not diaphoretic. No erythema. No pallor. NEUROLOGIC: Alert and oriented to person, place, and time. Normal reflexes, muscle tone coordination. No cranial nerve deficit noted. PSYCHIATRIC: Normal mood and affect. Normal behavior. Normal judgment and thought content. CARDIOVASCULAR: Normal heart rate noted, regular rhythm RESPIRATORY: Clear to auscultation bilaterally. Effort and breath sounds normal, no problems with respiration noted. BREASTS: Symmetric in size. No masses, skin changes, nipple drainage, or lymphadenopathy. ABDOMEN: Soft, normal bowel sounds, no distention noted.  No tenderness, rebound or guarding.  PELVIC: Normal appearing external genitalia; normal appearing vaginal mucosa  and cervix.  No abnormal discharge noted.  Pap smear obtained.  Normal uterine size, no other palpable masses, no uterine or adnexal tenderness. MUSCULOSKELETAL: Normal range of motion. No tenderness.  No cyanosis, clubbing, or edema.  2+ distal pulses.   Assessment:  Annual gynecologic examination with pap smear   Plan:  Will follow up results of pap smear and manage accordingly. Routine preventative health maintenance measures emphasized. Please refer to After Visit Summary for other counseling recommendations.  > 50% of time spent in face to face  time

## 2016-01-29 LAB — CYTOLOGY - PAP
Diagnosis: NEGATIVE
HPV (WINDOPATH): NOT DETECTED

## 2016-03-03 ENCOUNTER — Telehealth: Payer: Self-pay | Admitting: Physician Assistant

## 2016-03-03 NOTE — Telephone Encounter (Signed)
Called pt to ask about flu shot declined-

## 2016-04-21 ENCOUNTER — Encounter: Payer: Self-pay | Admitting: *Deleted

## 2016-04-21 ENCOUNTER — Other Ambulatory Visit (INDEPENDENT_AMBULATORY_CARE_PROVIDER_SITE_OTHER): Payer: BLUE CROSS/BLUE SHIELD | Admitting: *Deleted

## 2016-04-21 VITALS — BP 127/82 | HR 75 | Wt 130.0 lb

## 2016-04-21 DIAGNOSIS — R309 Painful micturition, unspecified: Secondary | ICD-10-CM | POA: Diagnosis not present

## 2016-04-21 LAB — POCT URINALYSIS DIPSTICK
BILIRUBIN UA: NEGATIVE
Nitrite, UA: POSITIVE
Protein, UA: NEGATIVE
Urobilinogen, UA: NEGATIVE (ref ?–2.0)
pH, UA: 7 (ref 5.0–8.0)

## 2016-04-21 MED ORDER — SULFAMETHOXAZOLE-TRIMETHOPRIM 800-160 MG PO TABS: 1.0000 | ORAL_TABLET | Freq: Two times a day (BID) | ORAL | 0 refills | Status: DC

## 2016-04-24 LAB — CULTURE, URINE COMPREHENSIVE

## 2017-06-11 IMAGING — US US MFM OB FOLLOW-UP
1 series · 14 of 28 positions shown · non-contrast
Comparison: none

[Series 1: us mfm ob follow-up · 48 acquisitions, 14 frames shown]
[im 2/48]
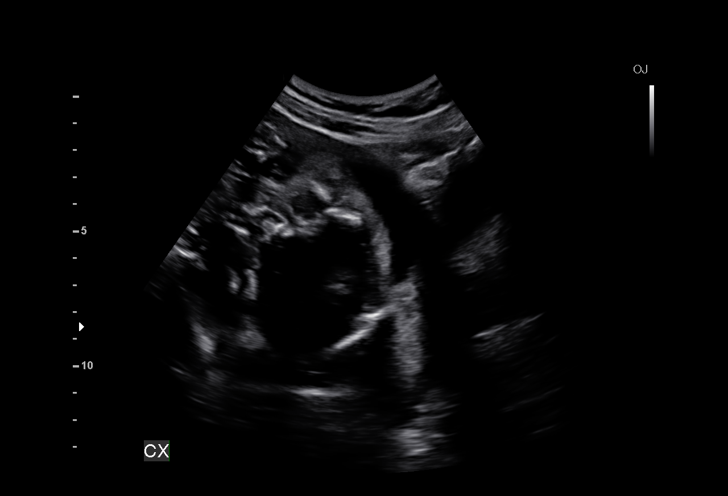
[im 6/48]
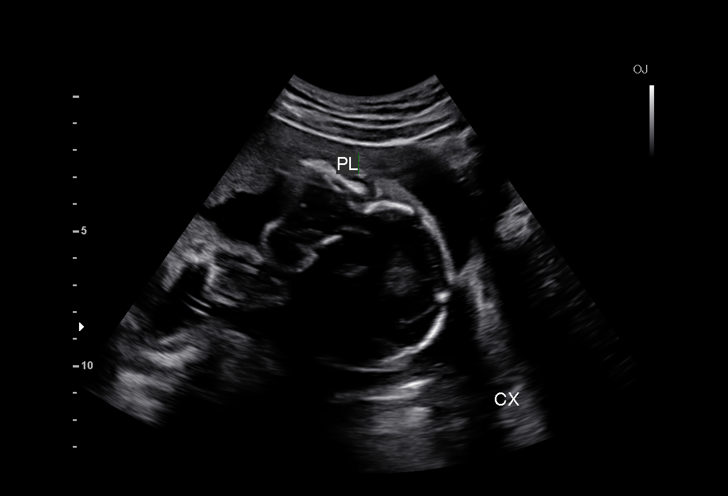
[im 9/48]
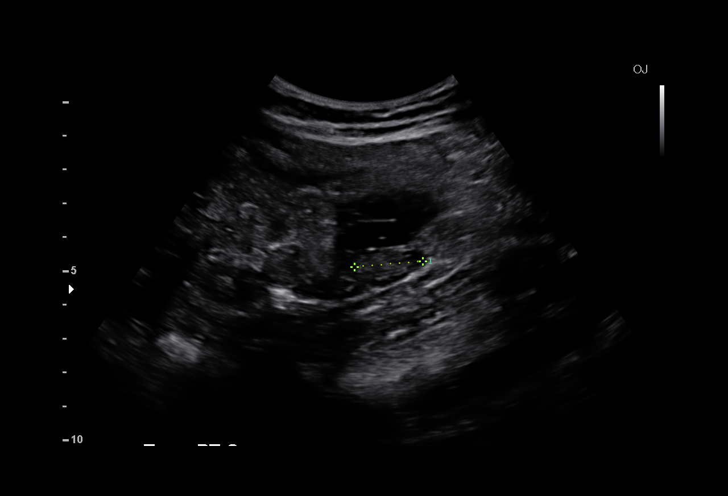
[im 13/48]
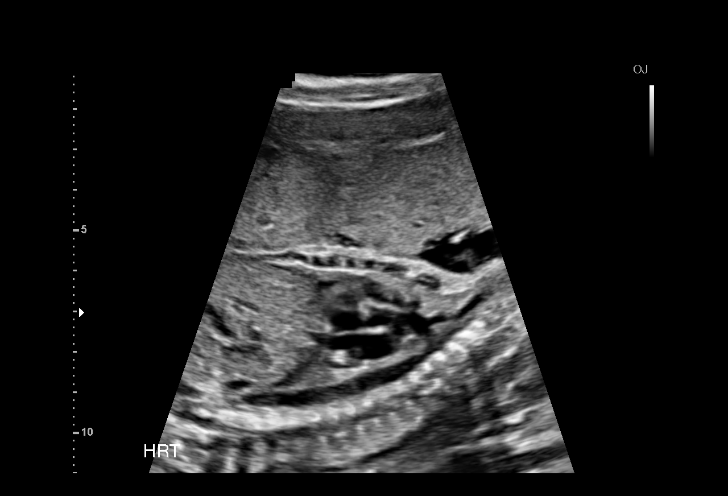
[im 16/48]
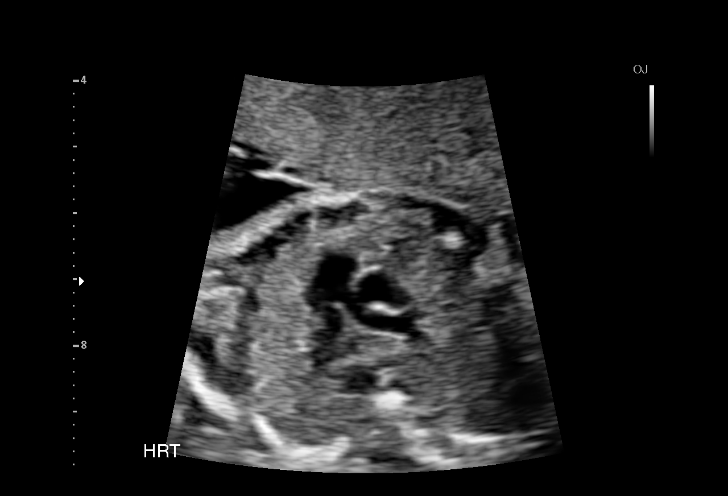
[im 20/48]
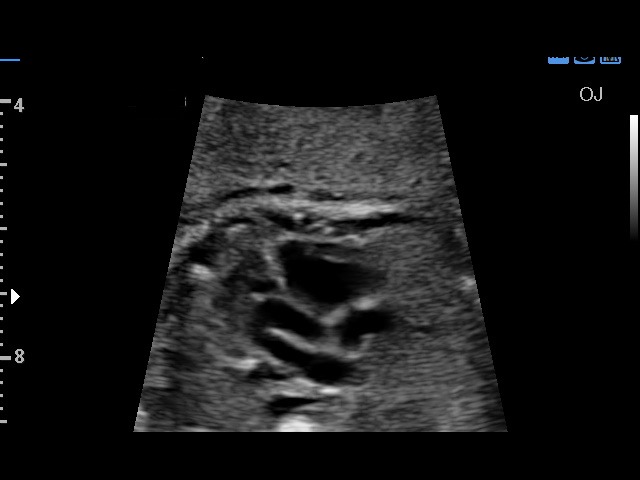
[im 23/48]
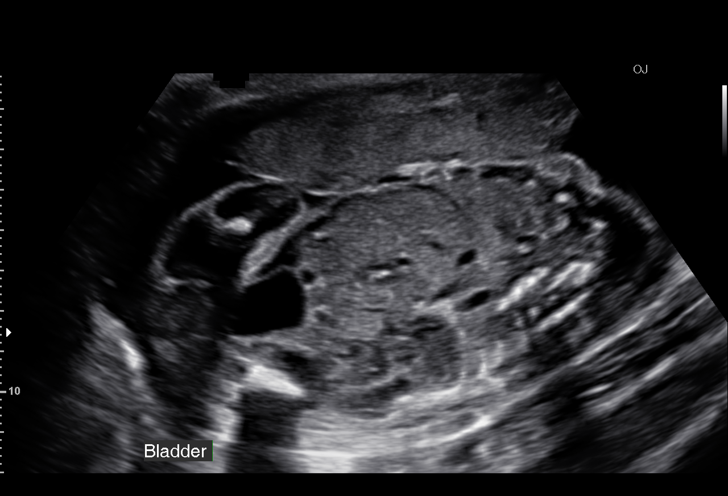
[im 27/48]
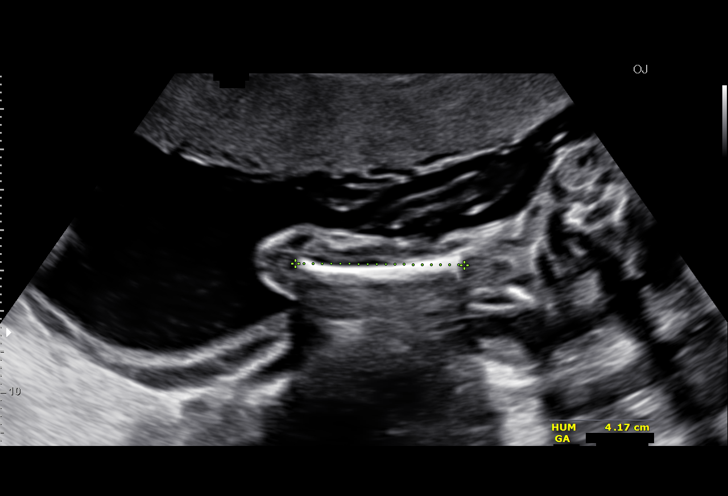
[im 30/48]
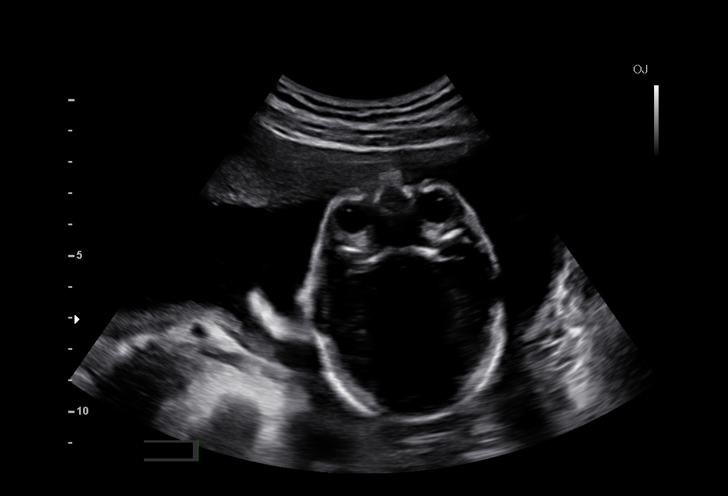
[im 34/48]
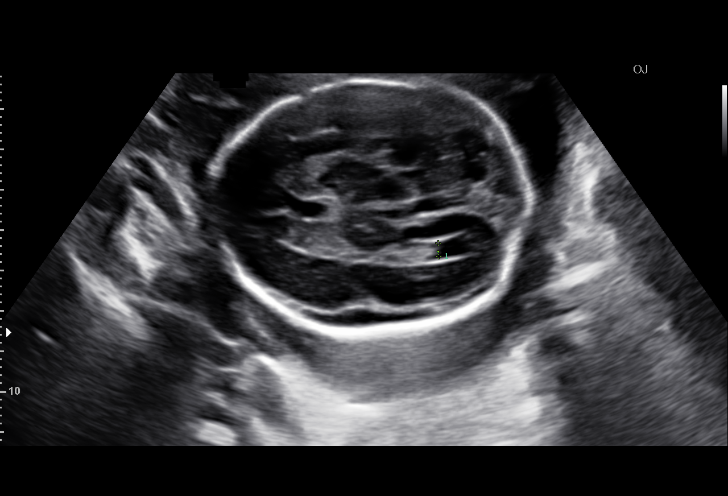
[im 37/48]
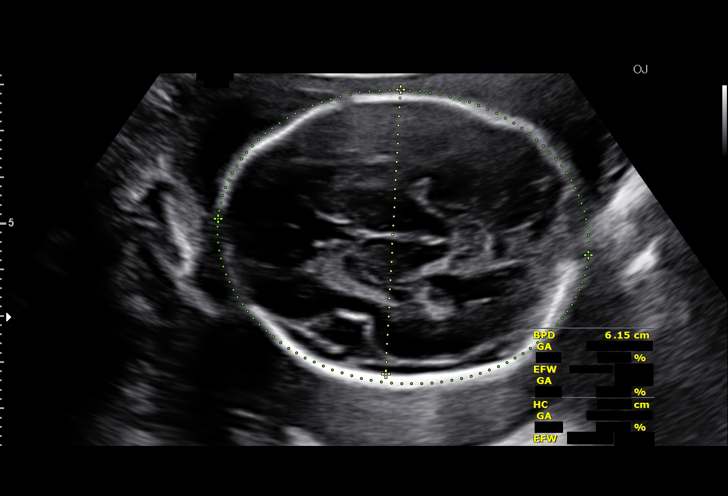
[im 41/48]
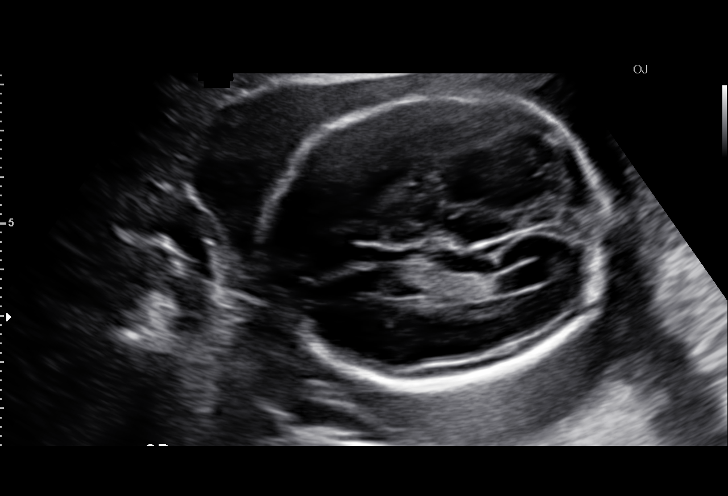
[im 44/48]
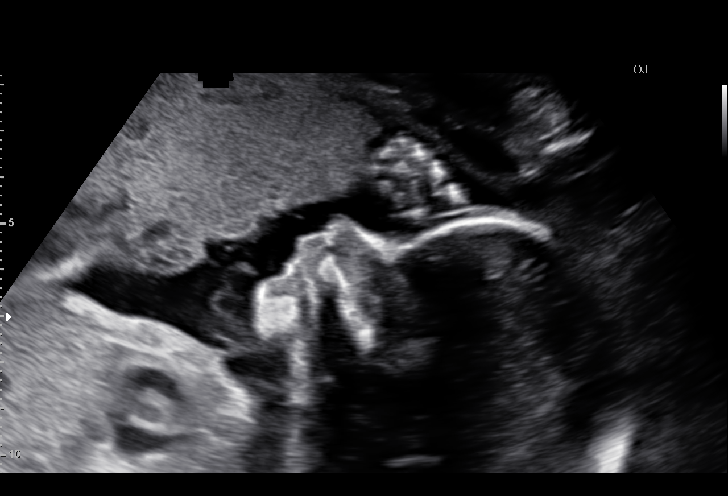
[im 48/48]
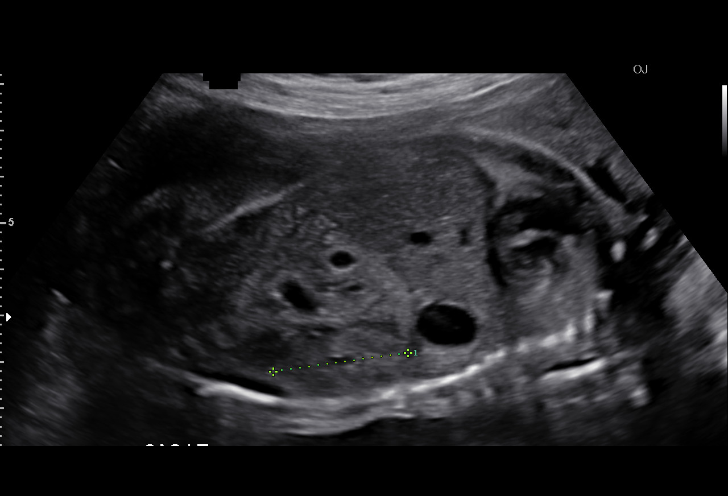

[14 of 28 positions shown; findings below may reference images not displayed]

[REDACTED]-
Faculty Physician

1  [facility] DON HAMMER            [id number]      [phone]     [id number]
Indications

24 weeks gestation of pregnancy
Follow-up incomplete fetal anatomic            Z36
evaluation
OB History

Gravidity:    1
Fetal Evaluation

Num Of Fetuses:     1
Fetal Heart         147
Rate(bpm):
Cardiac Activity:   Observed
Presentation:       Cephalic
Placenta:           Anterior, above cervical os
P. Cord Insertion:  Visualized

Amniotic Fluid
AFI FV:      Subjectively within normal limits
Larg Pckt:    5.3  cm
Biometry

BPD:      61.3  mm     G. Age:  24w 6d                  CI:         74.28  %    70 - 86
FL/HC:       19.5  %    18.7 -
HC:      225.8  mm     G. Age:  24w 4d         32  %    HC/AC:       1.08       1.04 -
AC:      209.2  mm     G. Age:  25w 4d         69  %    FL/BPD:      71.9  %    71 - 87
FL:       44.1  mm     G. Age:  24w 4d         35  %    FL/AC:       21.1  %    20 - 24
HUM:      41.8  mm     G. Age:  25w 2d         57  %

Est. FW:     760   gm    1 lb 11 oz     61  %
Gestational Age

LMP:           24w 4d        Date:  07/17/14                 EDD:    04/23/15
U/S Today:     24w 6d                                        EDD:    04/21/15
Best:          24w 4d     Det. By:  LMP  (07/17/14)          EDD:    04/23/15
Anatomy

Cranium:          Appears normal         Aortic Arch:      Appears normal
Fetal Cavum:      Appears normal         Ductal Arch:      Appears normal
Ventricles:       Appears normal         Diaphragm:        Appears normal
Choroid Plexus:   Previously seen        Stomach:          Appears normal, left
sided
Cerebellum:       Appears normal         Abdomen:          Appears normal
Posterior Fossa:  Appears normal         Abdominal Wall:   Not well visualized
Nuchal Fold:      Previously seen        Cord Vessels:     Appears normal (3
vessel cord)
Face:             Appears normal         Kidneys:          Appear normal
(orbits and profile)
Lips:             Previously seen        Bladder:          Appears normal
Heart:            Appears normal         Spine:            Previously seen
(4CH, axis, and
situs)
RVOT:             Appears normal         Upper             Previously seen
Extremities:
LVOT:             Appears normal         Lower             Previously seen
Extremities:
Cervix Uterus Adnexa

Cervix
Length:            4.3  cm.
Normal appearance by transabdominal scan.

Left Ovary
Not visualized. No adnexal mass visualized.

Right Ovary
Size(cm)     1.95   x   1.2    x  2         Vol(ml):
Impression

SIUP at 24+4 weeks
Normal interval anatomy; anatomic survey complete except
for CI
Normal amniotic fluid volume
Appropriate interval growth with EFW at the 61st %tile
Anterior placenta; no previa
Recommendations

Follow-up as clinically indicated

## 2017-12-22 ENCOUNTER — Ambulatory Visit (INDEPENDENT_AMBULATORY_CARE_PROVIDER_SITE_OTHER): Payer: BLUE CROSS/BLUE SHIELD | Admitting: Physician Assistant

## 2017-12-22 ENCOUNTER — Other Ambulatory Visit: Payer: Self-pay

## 2017-12-22 DIAGNOSIS — Z23 Encounter for immunization: Secondary | ICD-10-CM | POA: Diagnosis not present

## 2017-12-22 NOTE — Progress Notes (Signed)
Rx's removed per Pt request.

## 2018-06-15 ENCOUNTER — Encounter: Payer: Self-pay | Admitting: *Deleted

## 2018-06-15 ENCOUNTER — Other Ambulatory Visit (INDEPENDENT_AMBULATORY_CARE_PROVIDER_SITE_OTHER): Payer: BLUE CROSS/BLUE SHIELD | Admitting: *Deleted

## 2018-06-15 ENCOUNTER — Other Ambulatory Visit: Payer: Self-pay

## 2018-06-15 VITALS — BP 103/69 | HR 93 | Resp 16 | Ht 63.0 in | Wt 125.0 lb

## 2018-06-15 DIAGNOSIS — N390 Urinary tract infection, site not specified: Secondary | ICD-10-CM | POA: Diagnosis not present

## 2018-06-15 DIAGNOSIS — R319 Hematuria, unspecified: Secondary | ICD-10-CM | POA: Diagnosis not present

## 2018-06-15 LAB — POCT URINALYSIS DIPSTICK
Appearance: ABNORMAL
Bilirubin, UA: NEGATIVE
Glucose, UA: NEGATIVE
Ketones, UA: NEGATIVE
Nitrite, UA: POSITIVE
Protein, UA: NEGATIVE
Spec Grav, UA: 1.015 (ref 1.010–1.025)
Urobilinogen, UA: NEGATIVE E.U./dL — AB
pH, UA: 6 (ref 5.0–8.0)

## 2018-06-15 MED ORDER — PHENAZOPYRIDINE HCL 200 MG PO TABS: 200.0000 mg | ORAL_TABLET | Freq: Three times a day (TID) | ORAL | 0 refills | Status: DC | PRN

## 2018-06-15 MED ORDER — SULFAMETHOXAZOLE-TRIMETHOPRIM 800-160 MG PO TABS: 1.0000 | ORAL_TABLET | Freq: Two times a day (BID) | ORAL | 0 refills | Status: AC

## 2018-06-15 NOTE — Progress Notes (Signed)
SUBJECTIVE: Elizabeth Briggs is a 33 y.o. female who complains of urinary frequency, urgency and dysuria x 3 days, without flank pain, fever, chills, or abnormal vaginal discharge or bleeding.   OBJECTIVE: Appears well, in no apparent distress.  Vital signs are normal. Urine dipstick shows positive for Nitrites and Leukocytes     ASSESSMENT: Dysuria  PLAN: Treatment per orders with Bactrim DS and Pyridium.  Urine culture sent to lab. Call or return to clinic prn if these symptoms worsen or fail to improve as anticipated.

## 2018-06-18 ENCOUNTER — Other Ambulatory Visit: Payer: Self-pay | Admitting: Obstetrics & Gynecology

## 2018-06-18 LAB — URINE CULTURE
MICRO NUMBER:: 487990
SPECIMEN QUALITY:: ADEQUATE

## 2018-06-30 ENCOUNTER — Other Ambulatory Visit: Payer: Self-pay

## 2018-06-30 ENCOUNTER — Other Ambulatory Visit (INDEPENDENT_AMBULATORY_CARE_PROVIDER_SITE_OTHER): Payer: BC Managed Care – PPO | Admitting: *Deleted

## 2018-06-30 ENCOUNTER — Encounter: Payer: Self-pay | Admitting: *Deleted

## 2018-06-30 DIAGNOSIS — N390 Urinary tract infection, site not specified: Secondary | ICD-10-CM | POA: Diagnosis not present

## 2018-06-30 DIAGNOSIS — N898 Other specified noninflammatory disorders of vagina: Secondary | ICD-10-CM | POA: Diagnosis not present

## 2018-06-30 DIAGNOSIS — R309 Painful micturition, unspecified: Secondary | ICD-10-CM | POA: Diagnosis not present

## 2018-06-30 LAB — POCT URINALYSIS DIPSTICK
Appearance: NORMAL
Bilirubin, UA: NEGATIVE
Blood, UA: NEGATIVE
Glucose, UA: NEGATIVE
Ketones, UA: NEGATIVE
Leukocytes, UA: NEGATIVE
Nitrite, UA: NEGATIVE
Protein, UA: NEGATIVE
Spec Grav, UA: 1.01 (ref 1.010–1.025)
Urobilinogen, UA: NEGATIVE E.U./dL — AB
pH, UA: 6 (ref 5.0–8.0)

## 2018-06-30 NOTE — Progress Notes (Addendum)
SUBJECTIVE: Elizabeth Briggs is a 33 y.o. female who complains of urinary frequency, urgency  x 2 days, without flank pain, fever, chills, or abnormal vaginal discharge or bleeding. She recently finished a course of ATX due to UTI and then started having vaginal itching so she self medicated with OTC Monistat.  She now calls and states that when she goes to bathroom she is just dribbling and is uncomfortable.  Pt to drop off urine for C&S.  OBJECTIVE: Appears well, in no apparent distress.  Vital signs not taken as pt did a drive by.. Urine dipstick shows negative for all components.  Pt does still have a RF on Pyridium and will fill if needed.  She also states that she is about to start her period.  ASSESSMENT: Dysuria  PLAN: No treatment at this time will wait for cultures to return.  Call or return to clinic prn if these symptoms worsen or fail to improve as anticipated.

## 2018-07-01 LAB — URINE CULTURE
MICRO NUMBER:: 533129
Result:: NO GROWTH
SPECIMEN QUALITY:: ADEQUATE

## 2018-07-03 LAB — URINE CYTOLOGY ANCILLARY ONLY
Bacterial vaginitis: NEGATIVE
Candida vaginitis: NEGATIVE

## 2018-10-05 ENCOUNTER — Other Ambulatory Visit: Payer: Self-pay

## 2018-10-05 DIAGNOSIS — R6889 Other general symptoms and signs: Secondary | ICD-10-CM | POA: Diagnosis not present

## 2018-10-05 DIAGNOSIS — Z20822 Contact with and (suspected) exposure to covid-19: Secondary | ICD-10-CM

## 2018-10-07 LAB — NOVEL CORONAVIRUS, NAA: SARS-CoV-2, NAA: NOT DETECTED

## 2019-02-28 ENCOUNTER — Other Ambulatory Visit: Payer: Self-pay

## 2019-02-28 ENCOUNTER — Ambulatory Visit: Payer: BLUE CROSS/BLUE SHIELD | Attending: Internal Medicine

## 2019-02-28 DIAGNOSIS — Z20822 Contact with and (suspected) exposure to covid-19: Secondary | ICD-10-CM | POA: Diagnosis not present

## 2019-03-01 LAB — NOVEL CORONAVIRUS, NAA: SARS-CoV-2, NAA: NOT DETECTED

## 2019-03-07 ENCOUNTER — Telehealth: Payer: Self-pay | Admitting: Physician Assistant

## 2019-03-07 NOTE — Telephone Encounter (Signed)
PT demanded to come into office for sinus issues. She states its a "chronic". Negative covid test last week. Please advise.

## 2019-03-07 NOTE — Telephone Encounter (Signed)
If negative for fever, body aches, loss of smell or taste, GI symptoms and known direct covid contact then ok.

## 2019-03-09 ENCOUNTER — Other Ambulatory Visit: Payer: Self-pay

## 2019-03-09 ENCOUNTER — Ambulatory Visit: Payer: BLUE CROSS/BLUE SHIELD | Admitting: Physician Assistant

## 2019-03-09 VITALS — BP 132/97 | HR 86 | Temp 98.5°F | Ht 62.0 in | Wt 113.0 lb

## 2019-03-09 DIAGNOSIS — H6983 Other specified disorders of Eustachian tube, bilateral: Secondary | ICD-10-CM

## 2019-03-09 MED ORDER — METHYLPREDNISOLONE 4 MG PO TBPK: ORAL_TABLET | ORAL | 0 refills | Status: DC

## 2019-03-09 MED ORDER — FLUTICASONE PROPIONATE 50 MCG/ACT NA SUSP: 2.0000 | Freq: Every day | NASAL | 6 refills | Status: DC

## 2019-03-09 NOTE — Progress Notes (Signed)
Sinus congestion since October Feels like "balloon in head"  Has tried Claritin D/Zyrtec D/ Benadryl Runs two humidifiers at night Getting no better Ears look okay

## 2019-03-09 NOTE — Progress Notes (Signed)
Subjective:    Patient ID: Elizabeth Briggs, female    DOB: 23-Oct-1985, 34 y.o.   MRN: [id number]  HPI  Pt is a 34 yo female who presents to the clinic with bilateral ear problems but more right than left. She has always had intermittent problems with her ears. This problem started back in October. She feels like "her ears do not open up". She has lots of pressure and popping in ears. She denies any pain, discharge, headache, ST, hearing loss. At times she does feel a little dizzy and she does have some ringing in ears. No nausea. She has been using a lot of decongestants that help some and mucinex and saline flushes. No sinus pressure or facial pain. She does have some intermittent nasal congestion.   .. Active Ambulatory Problems    Diagnosis Date Noted  . Supervision of normal pregnancy 09/08/2014  . GBS (group B Streptococcus carrier), +RV culture, currently pregnant 04/03/2015  . Post term pregnancy over 40 weeks 04/29/2015  . SVD (spontaneous vaginal delivery) 05/02/2015  . Other seasonal allergic rhinitis 10/12/2015   Resolved Ambulatory Problems    Diagnosis Date Noted  . Well woman exam with routine gynecological exam 01/23/2014  . Family planning, natural methods to avoid pregnancy 01/23/2014   Past Medical History:  Diagnosis Date  . Abnormal Pap smear of cervix 2004       Review of Systems    see HPI>  Objective:   Physical Exam Vitals reviewed.  Constitutional:      Appearance: Normal appearance.  HENT:     Head: Normocephalic.     Comments: No facial pain or sinus pressure.     Right Ear: Ear canal and external ear normal. There is no impacted cerumen.     Left Ear: Tympanic membrane, ear canal and external ear normal. There is no impacted cerumen.     Ears:     Comments: Fluid level with air bubbles seen behind TM.  Good light reflex.     Nose: Nose normal.     Mouth/Throat:     Mouth: Mucous membranes are moist.  Eyes:     General:        Right eye: No  discharge.        Left eye: No discharge.     Extraocular Movements: Extraocular movements intact.     Pupils: Pupils are equal, round, and reactive to light.  Cardiovascular:     Rate and Rhythm: Normal rate.     Pulses: Normal pulses.  Pulmonary:     Effort: Pulmonary effort is normal.  Neurological:     Mental Status: She is alert.  Psychiatric:        Mood and Affect: Mood normal.           Assessment & Plan:  Marland KitchenMarland KitchenZoha was seen today for sinus problem.  Diagnoses and all orders for this visit:  Dysfunction of both eustachian tubes -     fluticasone (FLONASE) 50 MCG/ACT nasal spray; Place 2 sprays into both nostrils daily. -     methylPREDNISolone (MEDROL DOSEPAK) 4 MG TBPK tablet; Take as directed by package insert.   Sounds like ETD. Start flonase daily for 1 week then aftrin for 3-4 days then flonase for 1 week then afrin for 3-4 days then continue on daily flonase.  Stay on claritin. If no improvement in 1 week start oral medrol dose pack. HO given. If still no improvement then will refer to ENT. Reassurance  no sign of infection. Consider humidifer in room.

## 2019-03-09 NOTE — Patient Instructions (Signed)
Eustachian Tube Dysfunction ° °Eustachian tube dysfunction refers to a condition in which a blockage develops in the narrow passage that connects the middle ear to the back of the nose (eustachian tube). The eustachian tube regulates air pressure in the middle ear by letting air move between the ear and nose. It also helps to drain fluid from the middle ear space. °Eustachian tube dysfunction can affect one or both ears. When the eustachian tube does not function properly, air pressure, fluid, or both can build up in the middle ear. °What are the causes? °This condition occurs when the eustachian tube becomes blocked or cannot open normally. Common causes of this condition include: °· Ear infections. °· Colds and other infections that affect the nose, mouth, and throat (upper respiratory tract). °· Allergies. °· Irritation from cigarette smoke. °· Irritation from stomach acid coming up into the esophagus (gastroesophageal reflux). The esophagus is the tube that carries food from the mouth to the stomach. °· Sudden changes in air pressure, such as from descending in an airplane or scuba diving. °· Abnormal growths in the nose or throat, such as: °? Growths that line the nose (nasal polyps). °? Abnormal growth of cells (tumors). °? Enlarged tissue at the back of the throat (adenoids). °What increases the risk? °You are more likely to develop this condition if: °· You smoke. °· You are overweight. °· You are a child who has: °? Certain birth defects of the mouth, such as cleft palate. °? Large tonsils or adenoids. °What are the signs or symptoms? °Common symptoms of this condition include: °· A feeling of fullness in the ear. °· Ear pain. °· Clicking or popping noises in the ear. °· Ringing in the ear. °· Hearing loss. °· Loss of balance. °· Dizziness. °Symptoms may get worse when the air pressure around you changes, such as when you travel to an area of high elevation, fly on an airplane, or go scuba diving. °How is  this diagnosed? °This condition may be diagnosed based on: °· Your symptoms. °· A physical exam of your ears, nose, and throat. °· Tests, such as those that measure: °? The movement of your eardrum (tympanogram). °? Your hearing (audiometry). °How is this treated? °Treatment depends on the cause and severity of your condition. °· In mild cases, you may relieve your symptoms by moving air into your ears. This is called "popping the ears." °· In more severe cases, or if you have symptoms of fluid in your ears, treatment may include: °? Medicines to relieve congestion (decongestants). °? Medicines that treat allergies (antihistamines). °? Nasal sprays or ear drops that contain medicines that reduce swelling (steroids). °? A procedure to drain the fluid in your eardrum (myringotomy). In this procedure, a small tube is placed in the eardrum to: °§ Drain the fluid. °§ Restore the air in the middle ear space. °? A procedure to insert a balloon device through the nose to inflate the opening of the eustachian tube (balloon dilation). °Follow these instructions at home: °Lifestyle °· Do not do any of the following until your health care provider approves: °? Travel to high altitudes. °? Fly in airplanes. °? Work in a pressurized cabin or room. °? Scuba dive. °· Do not use any products that contain nicotine or tobacco, such as cigarettes and e-cigarettes. If you need help quitting, ask your health care provider. °· Keep your ears dry. Wear fitted earplugs during showering and bathing. Dry your ears completely after. °General instructions °· Take over-the-counter   and prescription medicines only as told by your health care provider. °· Use techniques to help pop your ears as recommended by your health care provider. These may include: °? Chewing gum. °? Yawning. °? Frequent, forceful swallowing. °? Closing your mouth, holding your nose closed, and gently blowing as if you are trying to blow air out of your nose. °· Keep all  follow-up visits as told by your health care provider. This is important. °Contact a health care provider if: °· Your symptoms do not go away after treatment. °· Your symptoms come back after treatment. °· You are unable to pop your ears. °· You have: °? A fever. °? Pain in your ear. °? Pain in your head or neck. °? Fluid draining from your ear. °· Your hearing suddenly changes. °· You become very dizzy. °· You lose your balance. °Summary °· Eustachian tube dysfunction refers to a condition in which a blockage develops in the eustachian tube. °· It can be caused by ear infections, allergies, inhaled irritants, or abnormal growths in the nose or throat. °· Symptoms include ear pain, hearing loss, or ringing in the ears. °· Mild cases are treated with maneuvers to unblock the ears, such as yawning or ear popping. °· Severe cases are treated with medicines. Surgery may also be done (rare). °This information is not intended to replace advice given to you by your health care provider. Make sure you discuss any questions you have with your health care provider. °Document Revised: 05/05/2017 Document Reviewed: 05/05/2017 °Elsevier Patient Education © 2020 [employer] Inc[employer] ° °

## 2019-03-11 ENCOUNTER — Encounter: Payer: Self-pay | Admitting: Physician Assistant

## 2019-03-11 DIAGNOSIS — H6983 Other specified disorders of Eustachian tube, bilateral: Secondary | ICD-10-CM | POA: Insufficient documentation

## 2019-04-11 ENCOUNTER — Encounter: Payer: Self-pay | Admitting: Physician Assistant

## 2019-04-11 MED ORDER — CLINDAMYCIN PHOS-BENZOYL PEROX 1-5 % EX GEL: Freq: Two times a day (BID) | CUTANEOUS | 3 refills | Status: DC

## 2019-05-10 ENCOUNTER — Encounter: Payer: Self-pay | Admitting: Physician Assistant

## 2019-05-10 ENCOUNTER — Ambulatory Visit (INDEPENDENT_AMBULATORY_CARE_PROVIDER_SITE_OTHER): Payer: BLUE CROSS/BLUE SHIELD | Admitting: Physician Assistant

## 2019-05-10 ENCOUNTER — Other Ambulatory Visit: Payer: Self-pay

## 2019-05-10 VITALS — BP 125/82 | HR 89 | Ht 62.0 in | Wt 119.0 lb

## 2019-05-10 DIAGNOSIS — Z1329 Encounter for screening for other suspected endocrine disorder: Secondary | ICD-10-CM | POA: Diagnosis not present

## 2019-05-10 DIAGNOSIS — Z0000 Encounter for general adult medical examination without abnormal findings: Secondary | ICD-10-CM | POA: Diagnosis not present

## 2019-05-10 DIAGNOSIS — Z131 Encounter for screening for diabetes mellitus: Secondary | ICD-10-CM | POA: Diagnosis not present

## 2019-05-10 DIAGNOSIS — Z13220 Encounter for screening for lipoid disorders: Secondary | ICD-10-CM

## 2019-05-10 DIAGNOSIS — L710 Perioral dermatitis: Secondary | ICD-10-CM | POA: Insufficient documentation

## 2019-05-10 MED ORDER — METRONIDAZOLE 0.75 % EX CREA: TOPICAL_CREAM | Freq: Two times a day (BID) | CUTANEOUS | 0 refills | Status: DC

## 2019-05-10 NOTE — Patient Instructions (Signed)
Health Maintenance, Female Adopting a healthy lifestyle and getting preventive care are important in promoting health and wellness. Ask your health care provider about:  The right schedule for you to have regular tests and exams.  Things you can do on your own to prevent diseases and keep yourself healthy. What should I know about diet, weight, and exercise? Eat a healthy diet   Eat a diet that includes plenty of vegetables, fruits, low-fat dairy products, and lean protein.  Do not eat a lot of foods that are high in solid fats, added sugars, or sodium. Maintain a healthy weight Body mass index (BMI) is used to identify weight problems. It estimates body fat based on height and weight. Your health care provider can help determine your BMI and help you achieve or maintain a healthy weight. Get regular exercise Get regular exercise. This is one of the most important things you can do for your health. Most adults should:  Exercise for at least 150 minutes each week. The exercise should increase your heart rate and make you sweat (moderate-intensity exercise).  Do strengthening exercises at least twice a week. This is in addition to the moderate-intensity exercise.  Spend less time sitting. Even light physical activity can be beneficial. Watch cholesterol and blood lipids Have your blood tested for lipids and cholesterol at 35 years of age, then have this test every 5 years. Have your cholesterol levels checked more often if:  Your lipid or cholesterol levels are high.  You are older than 35 years of age.  You are at high risk for heart disease. What should I know about cancer screening? Depending on your health history and family history, you may need to have cancer screening at various ages. This may include screening for:  Breast cancer.  Cervical cancer.  Colorectal cancer.  Skin cancer.  Lung cancer. What should I know about heart disease, diabetes, and high blood  pressure? Blood pressure and heart disease  High blood pressure causes heart disease and increases the risk of stroke. This is more likely to develop in people who have high blood pressure readings, are of African descent, or are overweight.  Have your blood pressure checked: ? Every 3-5 years if you are 18-39 years of age. ? Every year if you are 40 years old or older. Diabetes Have regular diabetes screenings. This checks your fasting blood sugar level. Have the screening done:  Once every three years after age 40 if you are at a normal weight and have a low risk for diabetes.  More often and at a younger age if you are overweight or have a high risk for diabetes. What should I know about preventing infection? Hepatitis B If you have a higher risk for hepatitis B, you should be screened for this virus. Talk with your health care provider to find out if you are at risk for hepatitis B infection. Hepatitis C Testing is recommended for:  Everyone born from 1945 through 1965.  Anyone with known risk factors for hepatitis C. Sexually transmitted infections (STIs)  Get screened for STIs, including gonorrhea and chlamydia, if: ? You are sexually active and are younger than 35 years of age. ? You are older than 35 years of age and your health care provider tells you that you are at risk for this type of infection. ? Your sexual activity has changed since you were last screened, and you are at increased risk for chlamydia or gonorrhea. Ask your health care provider if   you are at risk.  Ask your health care provider about whether you are at high risk for HIV. Your health care provider may recommend a prescription medicine to help prevent HIV infection. If you choose to take medicine to prevent HIV, you should first get tested for HIV. You should then be tested every 3 months for as long as you are taking the medicine. Pregnancy  If you are about to stop having your period (premenopausal) and  you may become pregnant, seek counseling before you get pregnant.  Take 400 to 800 micrograms (mcg) of folic acid every day if you become pregnant.  Ask for birth control (contraception) if you want to prevent pregnancy. Osteoporosis and menopause Osteoporosis is a disease in which the bones lose minerals and strength with aging. This can result in bone fractures. If you are 65 years old or older, or if you are at risk for osteoporosis and fractures, ask your health care provider if you should:  Be screened for bone loss.  Take a calcium or vitamin D supplement to lower your risk of fractures.  Be given hormone replacement therapy (HRT) to treat symptoms of menopause. Follow these instructions at home: Lifestyle  Do not use any products that contain nicotine or tobacco, such as cigarettes, e-cigarettes, and chewing tobacco. If you need help quitting, ask your health care provider.  Do not use street drugs.  Do not share needles.  Ask your health care provider for help if you need support or information about quitting drugs. Alcohol use  Do not drink alcohol if: ? Your health care provider tells you not to drink. ? You are pregnant, may be pregnant, or are planning to become pregnant.  If you drink alcohol: ? Limit how much you use to 0-1 drink a day. ? Limit intake if you are breastfeeding.  Be aware of how much alcohol is in your drink. In the [place] one drink equals one 12 oz bottle of beer (355 mL), one 5 oz glass of wine (148 mL), or one 1 oz glass of hard liquor (44 mL). General instructions  Schedule regular health, dental, and eye exams.  Stay current with your vaccines.  Tell your health care provider if: ? You often feel depressed. ? You have ever been abused or do not feel safe at home. Summary  Adopting a healthy lifestyle and getting preventive care are important in promoting health and wellness.  Follow your health care provider's instructions about healthy  diet, exercising, and getting tested or screened for diseases.  Follow your health care provider's instructions on monitoring your cholesterol and blood pressure. This information is not intended to replace advice given to you by your health care provider. Make sure you discuss any questions you have with your health care provider. Document Revised: 01/06/2018 Document Reviewed: 01/06/2018 Elsevier Patient Education  2020 Elsevier Inc.  

## 2019-05-10 NOTE — Progress Notes (Signed)
Subjective:     Elizabeth Briggs is a 34 y.o. female and is here for a comprehensive physical exam. The patient reports problems - continues to have rash around mouth and nose. benzaclin helped a lot but not resolved. itchy at times. does not have to wear a mask often just in public.    Social History   Socioeconomic History  . Marital status: Married    Spouse name: Not on file  . Number of children: Not on file  . Years of education: Not on file  . Highest education level: Not on file  Occupational History  . Not on file  Tobacco Use  . Smoking status: Former Smoker    Quit date: 09/08/2013    Years since quitting: 5.6  . Smokeless tobacco: Never Used  Substance and Sexual Activity  . Alcohol use: No    Alcohol/week: 0.0 standard drinks    Comment: Occasional  . Drug use: No  . Sexual activity: Yes    Partners: Male    Birth control/protection: None, Pill  Other Topics Concern  . Not on file  Social History Narrative  . Not on file   Social Determinants of Health   Financial Resource Strain:   . Difficulty of Paying Living Expenses:   Food Insecurity:   . Worried About Charity fundraiser in the Last Year:   . Arboriculturist in the Last Year:   Transportation Needs:   . Film/video editor (Medical):   Marland Kitchen Lack of Transportation (Non-Medical):   Physical Activity:   . Days of Exercise per Week:   . Minutes of Exercise per Session:   Stress:   . Feeling of Stress :   Social Connections:   . Frequency of Communication with Friends and Family:   . Frequency of Social Gatherings with Friends and Family:   . Attends Religious Services:   . Active Member of Clubs or Organizations:   . Attends Archivist Meetings:   Marland Kitchen Marital Status:   Intimate Partner Violence:   . Fear of Current or Ex-Partner:   . Emotionally Abused:   Marland Kitchen Physically Abused:   . Sexually Abused:    Health Maintenance  Topic Date Due  . PAP SMEAR-Modifier  05/10/2019 (Originally  01/25/2019)  . INFLUENZA VACCINE  08/28/2019  . TETANUS/TDAP  01/30/2025  . HIV Screening  Completed    The following portions of the patient's history were reviewed and updated as appropriate: allergies, current medications, past family history, past medical history, past social history, past surgical history and problem list.  Review of Systems A comprehensive review of systems was negative.   Objective:    BP 125/82   Pulse 89   Ht 5\' 2"  (1.575 m)   Wt 119 lb (54 kg)   SpO2 100%   BMI 21.77 kg/m  General appearance: alert, cooperative and appears stated age Head: Normocephalic, without obvious abnormality, atraumatic Eyes: conjunctivae/corneas clear. PERRL, EOM's intact. Fundi benign. Ears: normal TM's and external ear canals both ears Nose: Nares normal. Septum midline. Mucosa normal. No drainage or sinus tenderness. Throat: lips, mucosa, and tongue normal; teeth and gums normal Neck: no adenopathy, no carotid bruit, no JVD, supple, symmetrical, trachea midline and thyroid not enlarged, symmetric, no tenderness/mass/nodules Back: symmetric, no curvature. ROM normal. No CVA tenderness. Lungs: clear to auscultation bilaterally Heart: regular rate and rhythm, S1, S2 normal, no murmur, click, rub or gallop Abdomen: soft, non-tender; bowel sounds normal; no masses,  no organomegaly Extremities: extremities normal, atraumatic, no cyanosis or edema Pulses: 2+ and symmetric Skin: perioral erythema in patches with some erythematous papules.  Lymph nodes: Cervical, supraclavicular, and axillary nodes normal. Neurologic: Alert and oriented X 3, normal strength and tone. Normal symmetric reflexes. Normal coordination and gait   .Marland Kitchen Depression screen [facility] 2/9 05/10/2019  Decreased Interest 0  Down, Depressed, Hopeless 0  PHQ - 2 Score 0  Altered sleeping 0  Tired, decreased energy 0  Change in appetite 0  Feeling bad or failure about yourself  0  Trouble concentrating 0  Moving  slowly or fidgety/restless 0  Suicidal thoughts 0  PHQ-9 Score 0  Difficult doing work/chores Not difficult at all   .Marland Kitchen GAD 7 : Generalized Anxiety Score 05/10/2019  Nervous, Anxious, on Edge 0  Control/stop worrying 0  Worry too much - different things 0  Trouble relaxing 0  Restless 0  Easily annoyed or irritable 0  Afraid - awful might happen 0  Total GAD 7 Score 0  Anxiety Difficulty Not difficult at all     Assessment:    Healthy female exam.      Plan:    Marland KitchenMarland KitchenIsmael was seen today for annual exam.  Diagnoses and all orders for this visit:  Routine physical examination -     TSH -     CBC -     COMPLETE METABOLIC PANEL WITH GFR -     Lipid Panel w/reflex Direct LDL  Screening for diabetes mellitus -     COMPLETE METABOLIC PANEL WITH GFR  Screening for lipid disorders -     Lipid Panel w/reflex Direct LDL  Screening for thyroid disorder -     TSH  Perioral dermatitis -     metroNIDAZOLE (METROCREAM) 0.75 % cream; Apply topically 2 (two) times daily. Around mouth for perioral dermatitis   .Marland Kitchen Discussed 150 minutes of exercise a week.  Encouraged vitamin D 1000 units and Calcium 1300mg  or 4 servings of dairy a day.  Fasting labs ordered.  Will get pap at GYN.   Stop benzaclin and start metronidazole for peri oral rash.   Continue flonase as needed for ETD, bilaterally.  See After Visit Summary for Counseling Recommendations

## 2019-05-11 NOTE — Progress Notes (Signed)
Jaretzi,   Thyroid looks great.  Hemoglobin perfect.  Kidney and liver look good.  Glucose a hair elevated will add a1c to get baseline.  LDL good. Optimal is under 100 but with no risk factors and high HDL great levels. TG great.   Labs look great.   Lesly Rubenstein

## 2019-05-12 LAB — TSH: TSH: 1.32 mIU/L

## 2019-05-12 LAB — COMPLETE METABOLIC PANEL WITH GFR
AG Ratio: 1.9 (calc) (ref 1.0–2.5)
ALT: 20 U/L (ref 6–29)
AST: 21 U/L (ref 10–30)
Albumin: 4.5 g/dL (ref 3.6–5.1)
Alkaline phosphatase (APISO): 38 U/L (ref 31–125)
BUN: 13 mg/dL (ref 7–25)
CO2: 27 mmol/L (ref 20–32)
Calcium: 9.5 mg/dL (ref 8.6–10.2)
Chloride: 104 mmol/L (ref 98–110)
Creat: 0.76 mg/dL (ref 0.50–1.10)
GFR, Est African American: 118 mL/min/{1.73_m2} (ref >=60)
GFR, Est Non African American: 102 mL/min/{1.73_m2} (ref >=60)
Globulin: 2.4 g/dL (calc) (ref 1.9–3.7)
Glucose, Bld: 102 mg/dL — ABNORMAL HIGH (ref 65–99)
Potassium: 4.5 mmol/L (ref 3.5–5.3)
Sodium: 137 mmol/L (ref 135–146)
Total Bilirubin: 0.4 mg/dL (ref 0.2–1.2)
Total Protein: 6.9 g/dL (ref 6.1–8.1)

## 2019-05-12 LAB — CBC
HCT: 40.1 % (ref 35.0–45.0)
Hemoglobin: 13.3 g/dL (ref 11.7–15.5)
MCH: 29 pg (ref 27.0–33.0)
MCHC: 33.2 g/dL (ref 32.0–36.0)
MCV: 87.6 fL (ref 80.0–100.0)
MPV: 11.3 fL (ref 7.5–12.5)
Platelets: 217 10*3/uL (ref 140–400)
RBC: 4.58 10*6/uL (ref 3.80–5.10)
RDW: 12.6 % (ref 11.0–15.0)
WBC: 4.5 10*3/uL (ref 3.8–10.8)

## 2019-05-12 LAB — LIPID PANEL W/REFLEX DIRECT LDL
Cholesterol: 205 mg/dL — ABNORMAL HIGH (ref <200)
HDL: 79 mg/dL (ref >=50)
LDL Cholesterol (Calc): 111 mg/dL (calc) — ABNORMAL HIGH
Non-HDL Cholesterol (Calc): 126 mg/dL (calc) (ref <130)
Total CHOL/HDL Ratio: 2.6 (calc) (ref <5.0)
Triglycerides: 61 mg/dL (ref <150)

## 2019-05-12 LAB — HEMOGLOBIN A1C W/OUT EAG: Hgb A1c MFr Bld: 5.4 % of total Hgb (ref <5.7)

## 2019-05-12 NOTE — Progress Notes (Signed)
Arlis,   A1C normal at 5.4.

## 2019-06-01 ENCOUNTER — Encounter: Payer: Self-pay | Admitting: Physician Assistant

## 2019-06-02 ENCOUNTER — Other Ambulatory Visit: Payer: Self-pay | Admitting: Nurse Practitioner

## 2019-06-02 DIAGNOSIS — H6983 Other specified disorders of Eustachian tube, bilateral: Secondary | ICD-10-CM

## 2019-06-17 ENCOUNTER — Encounter (INDEPENDENT_AMBULATORY_CARE_PROVIDER_SITE_OTHER): Payer: Self-pay | Admitting: Otolaryngology

## 2019-06-17 ENCOUNTER — Other Ambulatory Visit: Payer: Self-pay

## 2019-06-17 ENCOUNTER — Ambulatory Visit (INDEPENDENT_AMBULATORY_CARE_PROVIDER_SITE_OTHER): Payer: BLUE CROSS/BLUE SHIELD | Admitting: Otolaryngology

## 2019-06-17 VITALS — Temp 97.9°F

## 2019-06-17 DIAGNOSIS — H6983 Other specified disorders of Eustachian tube, bilateral: Secondary | ICD-10-CM | POA: Diagnosis not present

## 2019-06-17 DIAGNOSIS — J310 Chronic rhinitis: Secondary | ICD-10-CM | POA: Diagnosis not present

## 2019-06-17 NOTE — Progress Notes (Signed)
HPI: Elizabeth Briggs is a 34 y.o. female who presents is referred by her PCP for evaluation of chronic ear complaints.  She apparently has had a long history of allergies and has used Flonase recently which seems to make symptoms much better.  But she also uses a lot of Claritin-D and Allegra-D which also helps improve her congestion in her head and congestion in her ears.  However the decongestant elevated her blood pressure and she was recommended not taking the decongestant medication. Presently she describes crackling and pressure in both ears with the left side worse than the right side.  She has not noticed much hearing problems but describes chronic pressure in the ears which again is worse on the left side. She has had no fever and no colored discharge from her nose.  Past Medical History:  Diagnosis Date  . Abnormal Pap smear of cervix 2004   Past Surgical History:  Procedure Laterality Date  . COLPOSCOPY    . MOUTH SURGERY     Social History   Socioeconomic History  . Marital status: Married    Spouse name: Not on file  . Number of children: Not on file  . Years of education: Not on file  . Highest education level: Not on file  Occupational History  . Not on file  Tobacco Use  . Smoking status: Former Smoker    Years: 12.00    Start date: 2003    Quit date: 09/08/2013    Years since quitting: 5.7  . Smokeless tobacco: Never Used  Substance and Sexual Activity  . Alcohol use: No    Alcohol/week: 0.0 standard drinks    Comment: Occasional  . Drug use: No  . Sexual activity: Yes    Partners: Male    Birth control/protection: None, Pill  Other Topics Concern  . Not on file  Social History Narrative  . Not on file   Social Determinants of Health   Financial Resource Strain:   . Difficulty of Paying Living Expenses:   Food Insecurity:   . Worried About Programme researcher, broadcasting/film/video in the Last Year:   . Barista in the Last Year:   Transportation Needs:   . Automotive engineer (Medical):   Marland Kitchen Lack of Transportation (Non-Medical):   Physical Activity:   . Days of Exercise per Week:   . Minutes of Exercise per Session:   Stress:   . Feeling of Stress :   Social Connections:   . Frequency of Communication with Friends and Family:   . Frequency of Social Gatherings with Friends and Family:   . Attends Religious Services:   . Active Member of Clubs or Organizations:   . Attends Banker Meetings:   Marland Kitchen Marital Status:    Family History  Problem Relation Age of Onset  . Hyperlipidemia Mother   . Endometriosis Mother   . Diabetes type I Brother   . Prostate cancer Father 67   No Known Allergies Prior to Admission medications   Medication Sig Start Date End Date Taking? Authorizing Provider  clindamycin-benzoyl peroxide (BENZACLIN) gel Apply topically 2 (two) times daily. 04/11/19  Yes Breeback, Jade L, PA-C  fluticasone (FLONASE) 50 MCG/ACT nasal spray Place 2 sprays into both nostrils daily. 03/09/19  Yes Breeback, Jade L, PA-C  metroNIDAZOLE (METROCREAM) 0.75 % cream Apply topically 2 (two) times daily. Around mouth for perioral dermatitis 05/10/19  Yes Breeback, Jade L, PA-C     Positive ROS:  Otherwise negative  All other systems have been reviewed and were otherwise negative with the exception of those mentioned in the HPI and as above.  Physical Exam: Constitutional: Alert, well-appearing, no acute distress Ears: External ears without lesions or tenderness. Ear canals are clear bilaterally.  TMs are clear bilaterally with good mobility pneumatic otoscopy.  On hearing screening with the tuning forks she heard well in both ears with AC > BC bilaterally. Nasal: External nose without lesions. Septum with minimal deformity..  Mild mucosal swelling with clear nasal passages bilaterally.  Both millimeters regions were clear with no evidence of acute infection or drainage.  No polyps noted intranasally. Oral: Lips and gums without lesions.  Tongue and palate mucosa without lesions. Posterior oropharynx clear. Neck: No palpable adenopathy or masses Respiratory: Breathing comfortably  Skin: No facial/neck lesions or rash noted.  Discussed extensively with the patient that I think her symptoms are mostly allergy related and possibly related to eustachian tube dysfunction.  She inquired about possible placement of myringotomy tubes.  Discussed with her that there is no evidence of actual fluid or infection in the ears.  I am not sure myringotomy tubes would be that beneficial although after much discussion I suggested trying a myringotomy in the left ear to see if this helps with the symptoms.  This was performed in the office today and the middle ear space was dry.  She will let us know if this improves her symptoms..  If the myringotomy improves her symptoms and the symptoms return after myringotomy site heals up in 2 weeks she will call us back to possibly place myringotomy tubes if warranted.  Myringotomy  Date/Time: 06/17/2019 5:28 PM Performed by: Rozetta Nunnery, MD Authorized by: Rozetta Nunnery, MD   Consent:    Consent obtained:  Verbal   Consent given by:  Patient   Risks discussed:  Bleeding and pain   Alternatives discussed:  No treatment Anesthesia:    Anesthesia method:  Topical application   Topical anesthetic:  Phenol Procedure Details:    Location:  Left TM   Hole made in:  Anteroinferior aspect of TM   Tube:  None Post-procedure details:    Patient tolerance of procedure:  Tolerated well, no immediate complications Comments:     A left myringotomy was performed on the anterior inferior left TM.  The middle ear space was dry.    Assessment: Eustachian tube dysfunction secondary to allergic rhinitis.  Plan: I discussed with the patient today concerning regular use of the Flonase 2 sprays each nostril at night.  Also gave her a prescription to try azelastine 0.1% 1 spray twice daily as this  will also help with allergies. Performed a myringotomy on the left TM in the office today to see if she has any benefit.  If this relieves her symptoms could possibly consider placement of myringotomy tube. Did recommend patient see an allergist as I think reactive rhinitis is her major contributing problem.  Would recommend regular use of nasal steroid and antihistamine spray and use of plain Claritin or Allegra as needed.  Would not recommend use of decongestant therapy more than once or twice a week.   Radene Journey, MD   CC:

## 2019-06-20 ENCOUNTER — Encounter: Payer: Self-pay | Admitting: Physician Assistant

## 2019-06-20 DIAGNOSIS — H6983 Other specified disorders of Eustachian tube, bilateral: Secondary | ICD-10-CM

## 2019-06-28 ENCOUNTER — Other Ambulatory Visit (HOSPITAL_COMMUNITY)
Admission: RE | Admit: 2019-06-28 | Discharge: 2019-06-28 | Disposition: A | Discharge status: Home / Self Care | Payer: BLUE CROSS/BLUE SHIELD | Source: Ambulatory Visit | Attending: Advanced Practice Midwife | Admitting: Advanced Practice Midwife

## 2019-06-28 ENCOUNTER — Other Ambulatory Visit: Payer: Self-pay

## 2019-06-28 ENCOUNTER — Ambulatory Visit (INDEPENDENT_AMBULATORY_CARE_PROVIDER_SITE_OTHER): Payer: BLUE CROSS/BLUE SHIELD | Admitting: Advanced Practice Midwife

## 2019-06-28 ENCOUNTER — Encounter: Payer: Self-pay | Admitting: Advanced Practice Midwife

## 2019-06-28 VITALS — BP 123/75 | HR 73 | Resp 16 | Ht 62.0 in | Wt 125.0 lb

## 2019-06-28 DIAGNOSIS — L2489 Irritant contact dermatitis due to other agents: Secondary | ICD-10-CM | POA: Diagnosis not present

## 2019-06-28 DIAGNOSIS — Z01419 Encounter for gynecological examination (general) (routine) without abnormal findings: Secondary | ICD-10-CM

## 2019-06-28 DIAGNOSIS — Z1272 Encounter for screening for malignant neoplasm of vagina: Secondary | ICD-10-CM

## 2019-06-28 MED ORDER — CLOBETASOL PROPIONATE 0.05 % EX OINT: 1.0000 "application " | TOPICAL_OINTMENT | Freq: Two times a day (BID) | CUTANEOUS | 0 refills | Status: DC

## 2019-06-28 NOTE — Patient Instructions (Signed)
Contact Dermatitis Dermatitis is redness, soreness, and swelling (inflammation) of the skin. Contact dermatitis is a reaction to certain substances that touch the skin. Many different substances can cause contact dermatitis. There are two types of contact dermatitis:  Irritant contact dermatitis. This type is caused by something that irritates your skin, such as having dry hands from washing them too often with soap. This type does not require previous exposure to the substance for a reaction to occur. This is the most common type.  Allergic contact dermatitis. This type is caused by a substance that you are allergic to, such as poison ivy. This type occurs when you have been exposed to the substance (allergen) and develop a sensitivity to it. Dermatitis may develop soon after your first exposure to the allergen, or it may not develop until the next time you are exposed and every time thereafter. What are the causes? Irritant contact dermatitis is most commonly caused by exposure to:  Makeup.  Soaps.  Detergents.  Bleaches.  Acids.  Metal salts, such as nickel. Allergic contact dermatitis is most commonly caused by exposure to:  Poisonous plants.  Chemicals.  Jewelry.  Latex.  Medicines.  Preservatives in products, such as clothing. What increases the risk? You are more likely to develop this condition if you have:  A job that exposes you to irritants or allergens.  Certain medical conditions, such as asthma or eczema. What are the signs or symptoms? Symptoms of this condition may occur on your body anywhere the irritant has touched you or is touched by you.  Symptoms include: ? Dryness or flaking. ? Redness. ? Cracks. ? Itching. ? Pain or a burning feeling. ? Blisters. ? Drainage of small amounts of blood or clear fluid from skin cracks. With allergic contact dermatitis, there may also be swelling in areas such as the eyelids, mouth, or genitals. How is this  diagnosed? This condition is diagnosed with a medical history and physical exam.  A patch skin test may be performed to help determine the cause.  If the condition is related to your job, you may need to see an occupational medicine specialist. How is this treated? This condition is treated by checking for the cause of the reaction and protecting your skin from further contact. Treatment may also include:  Steroid creams or ointments. Oral steroid medicines may be needed in more severe cases.  Antibiotic medicines or antibacterial ointments, if a skin infection is present.  Antihistamine lotion or an antihistamine taken by mouth to ease itching.  A bandage (dressing). Follow these instructions at home: Skin care  Moisturize your skin as needed.  Apply cool compresses to the affected areas.  Try applying baking soda paste to your skin. Stir water into baking soda until it reaches a paste-like consistency.  Do not scratch your skin, and avoid friction to the affected area.  Avoid the use of soaps, perfumes, and dyes. Medicines  Take or apply over-the-counter and prescription medicines only as told by your health care provider.  If you were prescribed an antibiotic medicine, take or apply the antibiotic as told by your health care provider. Do not stop using the antibiotic even if your condition improves. Bathing  Try taking a bath with: ? Epsom salts. Follow the instructions on the packaging. You can get these at your local pharmacy or grocery store. ? Baking soda. Pour a small amount into the bath as directed by your health care provider. ? Colloidal oatmeal. Follow the instructions on the   packaging. You can get this at your local pharmacy or grocery store.  Bathe less frequently, such as every other day.  Bathe in lukewarm water. Avoid using hot water. Bandage care  If you were given a bandage (dressing), change it as told by your health care provider.  Wash your hands  with soap and water before and after you change your dressing. If soap and water are not available, use hand sanitizer. General instructions  Avoid the substance that caused your reaction. If you do not know what caused it, keep a journal to try to track what caused it. Write down: ? What you eat. ? What cosmetic products you use. ? What you drink. ? What you wear in the affected area. This includes jewelry.  Check the affected areas every day for signs of infection. Check for: ? More redness, swelling, or pain. ? More fluid or blood. ? Warmth. ? Pus or a bad smell.  Keep all follow-up visits as told by your health care provider. This is important. Contact a health care provider if:  Your condition does not improve with treatment.  Your condition gets worse.  You have signs of infection such as swelling, tenderness, redness, soreness, or warmth in the affected area.  You have a fever.  You have new symptoms. Get help right away if:  You have a severe headache, neck pain, or neck stiffness.  You vomit.  You feel very sleepy.  You notice red streaks coming from the affected area.  Your bone or joint underneath the affected area becomes painful after the skin has healed.  The affected area turns darker.  You have difficulty breathing. Summary  Dermatitis is redness, soreness, and swelling (inflammation) of the skin. Contact dermatitis is a reaction to certain substances that touch the skin.  Symptoms of this condition may occur on your body anywhere the irritant has touched you or is touched by you.  This condition is treated by figuring out what caused the reaction and protecting your skin from further contact. Treatment may also include medicines and skin care.  Avoid the substance that caused your reaction. If you do not know what caused it, keep a journal to try to track what caused it.  Contact a health care provider if your condition gets worse or you have signs  of infection such as swelling, tenderness, redness, soreness, or warmth in the affected area. This information is not intended to replace advice given to you by your health care provider. Make sure you discuss any questions you have with your health care provider. Document Revised: 05/05/2018 Document Reviewed: 07/29/2017 Elsevier Patient Education  2020 [employer]  

## 2019-06-28 NOTE — Progress Notes (Signed)
Subjective:     Elizabeth Briggs is a 34 y.o. female here at [facility] for a routine exam.  Current complaints: irritation of vaginal area each month during and shortly after her menses.  No odor, no risk of STI, thinks it may be reaction to pads she uses.  Personal health questionnaire reviewed: yes.  Do you have a primary care provider? yes Do you feel safe at home? yes Has anyone hit, slapped, or kicked you recently? no Do you feel sad, tired, or upset most days or are you mostly happy with life? Mostly happy    Gynecologic History Patient's last menstrual period was 06/06/2019. Contraception: rhythm method Last Pap: 2018. Results were: normal Last mammogram: n/a.   Obstetric History OB History  Gravida Para Term Preterm AB Living  1 1 1  0 0 1  SAB TAB Ectopic Multiple Live Births  0 0 0 0 1    # Outcome Date GA Lbr Len/2nd Weight Sex Delivery Anes PTL Lv  1 Term 04/30/15 [redacted]w[redacted]d 14:46 / 02:22 7 lb 6.9 oz (3.37 kg) F Vag-Spont EPI  LIV     The following portions of the patient's history were reviewed and updated as appropriate: allergies, current medications, past family history, past medical history, past social history, past surgical history and problem list.  Review of Systems Pertinent items noted in HPI and remainder of comprehensive ROS otherwise negative.    Objective:     BP 123/75   Pulse 73   Resp 16   Ht 5\' 2"  (1.575 m)   Wt 125 lb (56.7 kg)   LMP 06/06/2019   Breastfeeding No   BMI 22.86 kg/m  VS reviewed, nursing note reviewed,  Constitutional: well developed, well nourished, no distress HEENT: normocephalic CV: normal rate Pulm/chest wall: normal effort Breast Exam:  right breast normal without abnormal mass, skin or nipple changes or axillary nodes, left breast normal without abnormal mass, skin or nipple changes or axillary nodes Abdomen: soft Neuro: alert and oriented x 3 Skin: warm, dry Psych: affect normal Pelvic exam: Cervix pink, visually  closed, without lesion, scant white creamy discharge, vaginal walls and external genitalia normal Bimanual exam: Cervix 0/long/high, firm, anterior, neg CMT, uterus nontender, nonenlarged, adnexa without tenderness, enlargement, or mass     Assessment/Plan:   1. Well woman exam with routine gynecological exam --Doing well, no complications except intermittent skin irritation, see below. - Cytology - PAP( [place])  2. Irritant contact dermatitis due to other agents --Likely caused by pad use, discussed alternatives including cotton washable pads, period underwear, cup options including Instead disposable cups and washable menstrual cups.   --Pt to treat irritation with topical steroid PRN - clobetasol ointment (TEMOVATE) 0.05 %; Apply 1 application topically 2 (two) times daily.  Dispense: 30 g; Refill: 0     Follow up in: 1 year or as needed.   , CNM 10:34 AM

## 2019-06-29 LAB — CYTOLOGY - PAP
Comment: NEGATIVE
Diagnosis: NEGATIVE
High risk HPV: NEGATIVE

## 2019-07-11 NOTE — Progress Notes (Signed)
New Patient Note  RE: Elizabeth Briggs MRN: [id number] DOB: 1985/05/21 Date of Office Visit: 07/12/2019  Referring provider: Nolene EbbsBreeback, Jade L, PA-C Primary care provider: Nolene EbbsBreeback, Jade L, PA-C  Chief Complaint: Pain (Ear)  History of Present Illness: I had the pleasure of seeing Elizabeth Briggs for initial evaluation at the [facility] on 07/12/2019. She is a 34 y.o. female, who is referred here by Jomarie LongsBreeback, Jade L, PA-C for the evaluation of eustachian tube dysfunction. Up to date with COVID-19 vaccine: no  She reports symptoms of ear pain, ear pressure, PND, sneezing. Symptoms have been going on for 9 months. The symptoms are present all year around. Other triggers include exposure to none. Anosmia: yes. Headache: yes. She has used Flonase, azelastine, zyrtec, oral decongestants with some improvement in symptoms. Sinus infections: no. Previous work up includes: none. Previous ENT evaluation: yes. Previous sinus imaging: no. History of nasal polyps: no. Last eye exam: more than 1 year ago. History of reflux: yes and takes Prilosec prn. Developed a mild rash on her face which may have been from Flonase?  06/17/2019 ENT note: "I discussed with the patient today concerning regular use of the Flonase 2 sprays each nostril at night.  Also gave her a prescription to try azelastine 0.1% 1 spray twice daily as this will also help with allergies. Performed a myringotomy on the left TM in the office today to see if she has any benefit.  If this relieves her symptoms could possibly consider placement of myringotomy tube. Did recommend patient see an allergist as I think reactive rhinitis is her major contributing problem.  Would recommend regular use of nasal steroid and antihistamine spray and use of plain Claritin or Allegra as needed.  Would not recommend use of decongestant therapy more than once or twice a week."  Assessment and Plan: Elizabeth Briggs is a 34 y.o. female with: Other  allergic rhinitis Rhinitis symptoms with ear pain and pressure for the past 9 months. Seen by ENT who referred her to allergy for testing. Tried Flonase, Azelastine, zyrtec and oral decongestants with some benefit.  Today's skin testing showed: Positive to grass, ragweed, weed, trees, dust mites, cat.  Start environmental control measures as below.  Continue with Flonase 1 spay per nostril twice a day for congestion.   If it causes the facial rash then stop and will switch to a different nasal steroid spray.   May use azelastine 1-2 sprays per nostril twice a day for drainage.  Nasal saline spray (i.e., Simply Saline) or nasal saline lavage (i.e., NeilMed) is recommended as needed and prior to medicated nasal sprays.  May use over the counter antihistamines such as Zyrtec (cetirizine), Claritin (loratadine), Allegra (fexofenadine), or Xyzal (levocetirizine) daily as needed. May take it twice a day.  Start Singulair (montelukast) 10mg  daily at night. Cautioned that in some children/adults can experience behavioral changes including hyperactivity, agitation, depression, sleep disturbances and suicidal ideations. These side effects are rare, but if you notice them you should notify me and discontinue Singulair (montelukast).  Had a detailed discussion with patient/family that clinical history is suggestive of allergic rhinitis, and may benefit from allergy immunotherapy (AIT). Discussed in detail regarding the dosing, schedule, side effects (mild to moderate local allergic reaction and rarely systemic allergic reactions including anaphylaxis), and benefits (significant improvement in nasal symptoms, seasonal flares of asthma) of immunotherapy with the patient. There is significant time commitment involved with allergy shots, which includes weekly immunotherapy injections for first  9-12 months and then biweekly to monthly injections for 3-5 years.   Consent was signed.   Start allergy  injections.  Dysfunction of both eustachian tubes  See assessment and plan as above.   Return in about 3 months (around 10/12/2019).  Meds ordered this encounter  Medications  . montelukast (SINGULAIR) 10 MG tablet    Sig: Take 1 tablet (10 mg total) by mouth at bedtime.    Dispense:  30 tablet    Refill:  5  . EPINEPHrine (AUVI-Q) 0.3 mg/0.3 mL IJ SOAJ injection    Sig: Inject 0.3 mLs (0.3 mg total) into the muscle once for 1 dose. As directed for life-threatening allergic reactions    Dispense:  2 each    Refill:  2   Other allergy screening: Asthma: no Food allergy: no Medication allergy: no Hymenoptera allergy: no Urticaria: no Eczema:yes History of recurrent infections suggestive of immunodeficency: no  Diagnostics: Skin Testing: Environmental allergy panel. Positive to grass, ragweed, weed, trees, dust mites, cat. Results discussed with patient/family.  Airborne Adult Perc - 07/12/19 1442    Time Antigen Placed 1442    Allergen Manufacturer Lavella Hammock    Location Back    Number of Test 59    Panel 1 Select    1. Control-Buffer 50% Glycerol Negative    2. Control-Histamine 1 mg/ml 2+    3. Albumin saline Negative    4. [place] 3+    5. [place] 4+    6. [place] 4+    7. [place] 4+    8. Meadow Fescue 3+    9. Perennial Rye 4+    10. Sweet Vernal 4+    11. Timothy 4+    12. Cocklebur 2+    13. Burweed Marshelder Negative    14. Ragweed, short 2+    15. Ragweed, Giant --   +/-   16. Plantain,  English Negative    17. Lamb's Quarters --   +/-   18. Sheep Sorrell 2+    19. Rough Pigweed 2+    20. Marsh Elder, Rough Negative    21. Mugwort, Common 2+    22. Ash mix 2+    23. Wendee Copp mix --   +/-   24. Beech American --   +/-   25. Box, Elder --   +/-   58. Cedar, red Negative    27. Cottonwood, Eastern 2+    28. Elm mix 2+    29. Hickory 2+    30. Maple mix Negative    31. Oak, [place] mix 2+    32. Pecan Pollen 3+    33. Pine mix Negative    34.  Sycamore Eastern 3+    35. [place], Black Pollen --   +/-   36. Alternaria alternata Negative    37. Cladosporium Herbarum Negative    38. Aspergillus mix Negative    39. Penicillium mix Negative    40. Bipolaris sorokiniana (Helminthosporium) Negative    41. Drechslera spicifera (Curvularia) Negative    42. Mucor plumbeus Negative    43. Fusarium moniliforme Negative    44. Aureobasidium pullulans (pullulara) Negative    45. Rhizopus oryzae Negative    46. Botrytis cinera Negative    47. Epicoccum nigrum Negative    48. Phoma betae Negative    49. Candida Albicans Negative    50. Trichophyton mentagrophytes Negative    51. Mite, D Farinae  5,000 AU/ml 2+    52. Mite, D Pteronyssinus  5,000 AU/ml 2+    53. Cat Hair 10,000 BAU/ml Negative    54.  Dog Epithelia Negative    55. Mixed Feathers Negative    56. Horse Epithelia Negative    57. Cockroach, German Negative    58. Mouse Negative    59. Tobacco Leaf Negative          Intradermal - 07/12/19 1522    Time Antigen Placed 1523    Allergen Manufacturer Waynette Buttery    Location Arm    Number of Test 8    Intradermal Select    Control Negative    Mold 1 Negative    Mold 2 Negative    Mold 3 Negative    Mold 4 Negative    Cat 2+    Dog Negative    Cockroach Negative           Past Medical History: Patient Active Problem List   Diagnosis Date Noted  . Perioral dermatitis 05/10/2019  . Dysfunction of both eustachian tubes 03/11/2019  . Other allergic rhinitis 10/12/2015   Past Medical History:  Diagnosis Date  . Abnormal Pap smear of cervix 2004  . Allergies   . Eczema    Past Surgical History: Past Surgical History:  Procedure Laterality Date  . COLPOSCOPY    . MOUTH SURGERY    . TYMPANOSTOMY TUBE PLACEMENT     Medication List:  Current Outpatient Medications  Medication Sig Dispense Refill  . azelastine (ASTELIN) 0.1 % nasal spray Place 2 sprays into both nostrils 2 (two) times daily.    . calcium-vitamin D  (OSCAL WITH D) 500-200 MG-UNIT tablet Take 1 tablet by mouth.    . Cetirizine HCl 10 MG CAPS Take 1 capsule by mouth daily.    . clobetasol ointment (TEMOVATE) 0.05 % Apply 1 application topically 2 (two) times daily. 30 g 0  . fluticasone (FLONASE) 50 MCG/ACT nasal spray Place 2 sprays into both nostrils daily. 16 g 6  . metroNIDAZOLE (METROCREAM) 0.75 % cream Apply topically 2 (two) times daily. Around mouth for perioral dermatitis 45 g 0  . omeprazole (PRILOSEC) 20 MG capsule Take 20 mg by mouth daily.    Marland Kitchen EPINEPHrine (AUVI-Q) 0.3 mg/0.3 mL IJ SOAJ injection Inject 0.3 mLs (0.3 mg total) into the muscle once for 1 dose. As directed for life-threatening allergic reactions 2 each 2  . montelukast (SINGULAIR) 10 MG tablet Take 1 tablet (10 mg total) by mouth at bedtime. 30 tablet 5   No current facility-administered medications for this visit.   Allergies: No Known Allergies Social History: Social History   Socioeconomic History  . Marital status: Married    Spouse name: Not on file  . Number of children: Not on file  . Years of education: Not on file  . Highest education level: Not on file  Occupational History  . Not on file  Tobacco Use  . Smoking status: Former Smoker    Years: 12.00    Start date: 2003    Quit date: 09/08/2013    Years since quitting: 5.8  . Smokeless tobacco: Never Used  Vaping Use  . Vaping Use: Never used  Substance and Sexual Activity  . Alcohol use: No    Alcohol/week: 0.0 standard drinks    Comment: Occasional  . Drug use: No  . Sexual activity: Yes    Partners: Male    Birth control/protection: None  Other Topics Concern  . Not on file  Social History Narrative  .  Not on file   Social Determinants of Health   Financial Resource Strain:   . Difficulty of Paying Living Expenses:   Food Insecurity:   . Worried About Programme researcher, broadcasting/film/video in the Last Year:   . Barista in the Last Year:   Transportation Needs:   . Automotive engineer (Medical):   Marland Kitchen Lack of Transportation (Non-Medical):   Physical Activity:   . Days of Exercise per Week:   . Minutes of Exercise per Session:   Stress:   . Feeling of Stress :   Social Connections:   . Frequency of Communication with Friends and Family:   . Frequency of Social Gatherings with Friends and Family:   . Attends Religious Services:   . Active Member of Clubs or Organizations:   . Attends Banker Meetings:   Marland Kitchen Marital Status:    Lives in a 35 year old home. Smoking: quit in 2016, 1/2pack for 10 years Occupation: Horticulturist, commercial History: Water Damage/mildew in the house: no Carpet in the family room: no Carpet in the bedroom: no Heating: wood Cooling: window Pet: yes 1 cat x 13 yrs  Family History: Family History  Problem Relation Age of Onset  . Hyperlipidemia Mother   . Endometriosis Mother   . Allergic rhinitis Mother   . Diabetes type I Brother   . Prostate cancer Father 66  . Eczema Father    Review of Systems  Constitutional: Negative for appetite change, chills, fever and unexpected weight change.  HENT: Positive for congestion, ear pain and postnasal drip. Negative for sneezing.   Eyes: Negative for itching.  Respiratory: Negative for cough, chest tightness, shortness of breath and wheezing.   Cardiovascular: Negative for chest pain.  Gastrointestinal: Negative for abdominal pain.  Genitourinary: Negative for difficulty urinating.  Skin: Negative for rash.  Allergic/Immunologic: Positive for environmental allergies.  Neurological: Positive for headaches.   Objective: BP 126/78 (BP Location: Left Arm, Patient Position: Sitting, Cuff Size: Normal)   Pulse 84   Temp 98.3 F (36.8 C) (Temporal)   Resp 18   Ht 5\' 3"  (1.6 m)   Wt 122 lb 12 oz (55.7 kg)   SpO2 97%   BMI 21.74 kg/m  Body mass index is 21.74 kg/m. Physical Exam Vitals and nursing note reviewed.  Constitutional:      Appearance:  Normal appearance. She is well-developed.  HENT:     Head: Normocephalic and atraumatic.     Right Ear: External ear normal.     Left Ear: External ear normal.     Ears:     Comments: Left TM - blood behind TM due to recent myringotomy.    Nose: Nose normal.     Mouth/Throat:     Mouth: Mucous membranes are moist.     Pharynx: Oropharynx is clear.  Eyes:     Conjunctiva/sclera: Conjunctivae normal.  Cardiovascular:     Rate and Rhythm: Normal rate and regular rhythm.     Heart sounds: Normal heart sounds. No murmur heard.  No friction rub. No gallop.   Pulmonary:     Effort: Pulmonary effort is normal.     Breath sounds: Normal breath sounds. No wheezing, rhonchi or rales.  Abdominal:     Palpations: Abdomen is soft.  Musculoskeletal:     Cervical back: Neck supple.  Skin:    General: Skin is warm.     Findings: No rash.  Neurological:  Mental Status: She is alert and oriented to person, place, and time.  Psychiatric:        Mood and Affect: Mood normal.        Behavior: Behavior normal.    The plan was reviewed with the patient/family, and all questions/concerned were addressed.  It was my pleasure to see Tajah today and participate in her care. Please feel free to contact me with any questions or concerns.  Sincerely,  Wyline Mood, DO Allergy & Immunology  [facility] office: [phone] [facility] office: [phone] [place] office: [phone]

## 2019-07-12 ENCOUNTER — Other Ambulatory Visit: Payer: Self-pay

## 2019-07-12 ENCOUNTER — Ambulatory Visit (INDEPENDENT_AMBULATORY_CARE_PROVIDER_SITE_OTHER): Payer: BLUE CROSS/BLUE SHIELD | Admitting: Allergy

## 2019-07-12 ENCOUNTER — Encounter: Payer: Self-pay | Admitting: Allergy

## 2019-07-12 VITALS — BP 126/78 | HR 84 | Temp 98.3°F | Resp 18 | Ht 63.0 in | Wt 122.8 lb

## 2019-07-12 DIAGNOSIS — J3089 Other allergic rhinitis: Secondary | ICD-10-CM | POA: Diagnosis not present

## 2019-07-12 DIAGNOSIS — H6983 Other specified disorders of Eustachian tube, bilateral: Secondary | ICD-10-CM

## 2019-07-12 MED ORDER — EPINEPHRINE 0.3 MG/0.3ML IJ SOAJ: 0.3000 mg | Freq: Once | INTRAMUSCULAR | 2 refills | Status: AC

## 2019-07-12 MED ORDER — MONTELUKAST SODIUM 10 MG PO TABS
10.0000 mg | ORAL_TABLET | Freq: Every day | ORAL | 5 refills | Status: DC
Start: 2019-07-12 — End: 2020-01-11

## 2019-07-12 NOTE — Assessment & Plan Note (Signed)
Rhinitis symptoms with ear pain and pressure for the past 9 months. Seen by ENT who referred her to allergy for testing. Tried Flonase, Azelastine, zyrtec and oral decongestants with some benefit.  Today's skin testing showed: Positive to grass, ragweed, weed, trees, dust mites, cat.  Start environmental control measures as below.  Continue with Flonase 1 spay per nostril twice a day for congestion.   If it causes the facial rash then stop and will switch to a different nasal steroid spray.   May use azelastine 1-2 sprays per nostril twice a day for drainage.  Nasal saline spray (i.e., Simply Saline) or nasal saline lavage (i.e., NeilMed) is recommended as needed and prior to medicated nasal sprays.  May use over the counter antihistamines such as Zyrtec (cetirizine), Claritin (loratadine), Allegra (fexofenadine), or Xyzal (levocetirizine) daily as needed. May take it twice a day.  Start Singulair (montelukast) 10mg  daily at night. Cautioned that in some children/adults can experience behavioral changes including hyperactivity, agitation, depression, sleep disturbances and suicidal ideations. These side effects are rare, but if you notice them you should notify me and discontinue Singulair (montelukast).  Had a detailed discussion with patient/family that clinical history is suggestive of allergic rhinitis, and may benefit from allergy immunotherapy (AIT). Discussed in detail regarding the dosing, schedule, side effects (mild to moderate local allergic reaction and rarely systemic allergic reactions including anaphylaxis), and benefits (significant improvement in nasal symptoms, seasonal flares of asthma) of immunotherapy with the patient. There is significant time commitment involved with allergy shots, which includes weekly immunotherapy injections for first 9-12 months and then biweekly to monthly injections for 3-5 years.   Consent was signed.   Start allergy injections.

## 2019-07-12 NOTE — Patient Instructions (Addendum)
Today's skin testing showed: Positive to grass, ragweed, weed, trees, dust mites, cat.  Environmental allergies  Start environmental control measures as below.  Continue with Flonase 1 spay per nostril twice a day.  May use azelastine 1-2 sprays per nostril twice a day.   Nasal saline spray (i.e., Simply Saline) or nasal saline lavage (i.e., NeilMed) is recommended as needed and prior to medicated nasal sprays.   May use over the counter antihistamines such as Zyrtec (cetirizine), Claritin (loratadine), Allegra (fexofenadine), or Xyzal (levocetirizine) daily as needed. May take it twice a day.  Start Singulair (montelukast) 10mg  daily at night. Cautioned that in some children/adults can experience behavioral changes including hyperactivity, agitation, depression, sleep disturbances and suicidal ideations. These side effects are rare, but if you notice them you should notify me and discontinue Singulair (montelukast).  Had a detailed discussion with patient/family that clinical history is suggestive of allergic rhinitis, and may benefit from allergy immunotherapy (AIT). Discussed in detail regarding the dosing, schedule, side effects (mild to moderate local allergic reaction and rarely systemic allergic reactions including anaphylaxis), and benefits (significant improvement in nasal symptoms, seasonal flares of asthma) of immunotherapy with the patient. There is significant time commitment involved with allergy shots, which includes weekly immunotherapy injections for first 9-12 months and then biweekly to monthly injections for 3-5 years.   Consent was signed.   Start allergy injections.  Follow up in 3 months or sooner if needed.  Follow up in 3 weeks for injections.   Reducing Pollen Exposure . Pollen seasons: trees (spring), grass (summer) and ragweed/weeds (fall). [birth year]1980 Keep windows closed in your home and car to lower pollen exposure.  Marland Kitchen air conditioning in the bedroom and  throughout the house if possible.  . Avoid going out in dry windy days - especially early morning. . Pollen counts are highest between 5 - 10 AM and on dry, hot and windy days.  . Save outside activities for late afternoon or after a heavy rain, when pollen levels are lower.  . Avoid mowing of grass if you have grass pollen allergy. Lilian Kapur Be aware that pollen can also be transported indoors on people and pets.  . Dry your clothes in an automatic dryer rather than hanging them outside where they might collect pollen.  . Rinse hair and eyes before bedtime. Control of House Dust Mite Allergen . Dust mite allergens are a common trigger of allergy and asthma symptoms. While they can be found throughout the house, these microscopic creatures thrive in warm, humid environments such as bedding, upholstered furniture and carpeting. . Because so much time is spent in the bedroom, it is essential to reduce mite levels there.  . Encase pillows, mattresses, and box springs in special allergen-proof fabric covers or airtight, zippered plastic covers.  . Bedding should be washed weekly in hot water (130 F) and dried in a hot dryer. Allergen-proof covers are available for comforters and pillows that can't be regularly washed.  Marland Kitchen the allergy-proof covers every few months. Minimize clutter in the bedroom. Keep pets out of the bedroom.  Reyes Ivan Keep humidity less than 50% by using a dehumidifier or air conditioning. You can buy a humidity measuring device called a hygrometer to monitor this.  . If possible, replace carpets with hardwood, linoleum, or washable area rugs. If that's not possible, vacuum frequently with a vacuum that has a HEPA filter. . Remove all upholstered furniture and non-washable window drapes from the bedroom. . Remove all non-washable stuffed  toys from the bedroom.  Wash stuffed toys weekly. Pet Allergen Avoidance: . Contrary to popular opinion, there are no "hypoallergenic" breeds of dogs or cats.  That is because people are not allergic to an animal's hair, but to an allergen found in the animal's saliva, dander (dead skin flakes) or urine. Pet allergy symptoms typically occur within minutes. For some people, symptoms can build up and become most severe 8 to 12 hours after contact with the animal. People with severe allergies can experience reactions in public places if dander has been transported on the pet owners' clothing. Marland Kitchen Keeping an animal outdoors is only a partial solution, since homes with pets in the yard still have higher concentrations of animal allergens. . Before getting a pet, ask your allergist to determine if you are allergic to animals. If your pet is already considered part of your family, try to minimize contact and keep the pet out of the bedroom and other rooms where you spend a great deal of time. . As with dust mites, vacuum carpets often or replace carpet with a hardwood floor, tile or linoleum. . High-efficiency particulate air (HEPA) cleaners can reduce allergen levels over time. . While dander and saliva are the source of cat and dog allergens, urine is the source of allergens from rabbits, hamsters, mice and [place] pigs; so ask a non-allergic family member to clean the animal's cage. . If you have a pet allergy, talk to your allergist about the potential for allergy immunotherapy (allergy shots). This strategy can often provide long-term relief.

## 2019-07-12 NOTE — Assessment & Plan Note (Signed)
.   See assessment and plan as above. 

## 2019-07-13 NOTE — Progress Notes (Signed)
EXP 07/17/20 

## 2019-07-18 DIAGNOSIS — J301 Allergic rhinitis due to pollen: Secondary | ICD-10-CM | POA: Diagnosis not present

## 2019-07-19 DIAGNOSIS — J3089 Other allergic rhinitis: Secondary | ICD-10-CM | POA: Diagnosis not present

## 2019-07-27 ENCOUNTER — Encounter: Payer: Self-pay | Admitting: Allergy

## 2019-08-02 ENCOUNTER — Other Ambulatory Visit: Payer: Self-pay

## 2019-08-02 ENCOUNTER — Ambulatory Visit (INDEPENDENT_AMBULATORY_CARE_PROVIDER_SITE_OTHER): Payer: BLUE CROSS/BLUE SHIELD

## 2019-08-02 DIAGNOSIS — J309 Allergic rhinitis, unspecified: Secondary | ICD-10-CM

## 2019-08-02 NOTE — Progress Notes (Signed)
Immunotherapy   Patient Details  Name: Elizabeth Briggs MRN: [id number] Date of Birth: Mar 25, 1985  08/02/2019  [name] here to start allergy injections. Patient received 0.05 of both her blue vials with an expiration of 07/17/2020. One with [id number] and the other with DM-C-CR. Patient waited 30 minutes with no problems. Following schedule: B Frequency: 1-2 times weekly Epi-Pen: Yes Consent signed and patient instructions given.   [name] 08/02/2019, 8:53 AM

## 2019-08-04 ENCOUNTER — Other Ambulatory Visit: Payer: Self-pay

## 2019-08-04 ENCOUNTER — Ambulatory Visit (INDEPENDENT_AMBULATORY_CARE_PROVIDER_SITE_OTHER): Payer: BLUE CROSS/BLUE SHIELD

## 2019-08-04 DIAGNOSIS — J309 Allergic rhinitis, unspecified: Secondary | ICD-10-CM

## 2019-08-09 ENCOUNTER — Ambulatory Visit (INDEPENDENT_AMBULATORY_CARE_PROVIDER_SITE_OTHER): Payer: BLUE CROSS/BLUE SHIELD

## 2019-08-09 ENCOUNTER — Other Ambulatory Visit: Payer: Self-pay

## 2019-08-09 DIAGNOSIS — J309 Allergic rhinitis, unspecified: Secondary | ICD-10-CM | POA: Diagnosis not present

## 2019-08-11 ENCOUNTER — Ambulatory Visit (INDEPENDENT_AMBULATORY_CARE_PROVIDER_SITE_OTHER): Payer: BLUE CROSS/BLUE SHIELD

## 2019-08-11 ENCOUNTER — Other Ambulatory Visit: Payer: Self-pay

## 2019-08-11 DIAGNOSIS — J309 Allergic rhinitis, unspecified: Secondary | ICD-10-CM

## 2019-08-16 ENCOUNTER — Other Ambulatory Visit: Payer: Self-pay

## 2019-08-16 ENCOUNTER — Ambulatory Visit (INDEPENDENT_AMBULATORY_CARE_PROVIDER_SITE_OTHER): Payer: BLUE CROSS/BLUE SHIELD

## 2019-08-16 DIAGNOSIS — J309 Allergic rhinitis, unspecified: Secondary | ICD-10-CM

## 2019-08-18 ENCOUNTER — Other Ambulatory Visit: Payer: Self-pay

## 2019-08-18 ENCOUNTER — Ambulatory Visit (INDEPENDENT_AMBULATORY_CARE_PROVIDER_SITE_OTHER): Payer: BLUE CROSS/BLUE SHIELD

## 2019-08-18 DIAGNOSIS — J309 Allergic rhinitis, unspecified: Secondary | ICD-10-CM

## 2019-08-19 ENCOUNTER — Encounter: Payer: Self-pay | Admitting: Allergy

## 2019-08-23 ENCOUNTER — Ambulatory Visit (INDEPENDENT_AMBULATORY_CARE_PROVIDER_SITE_OTHER): Payer: BLUE CROSS/BLUE SHIELD

## 2019-08-23 ENCOUNTER — Other Ambulatory Visit: Payer: Self-pay

## 2019-08-23 DIAGNOSIS — J309 Allergic rhinitis, unspecified: Secondary | ICD-10-CM

## 2019-08-25 ENCOUNTER — Other Ambulatory Visit: Payer: Self-pay

## 2019-08-25 ENCOUNTER — Ambulatory Visit (INDEPENDENT_AMBULATORY_CARE_PROVIDER_SITE_OTHER): Payer: BLUE CROSS/BLUE SHIELD

## 2019-08-25 DIAGNOSIS — J309 Allergic rhinitis, unspecified: Secondary | ICD-10-CM

## 2019-08-30 ENCOUNTER — Ambulatory Visit (INDEPENDENT_AMBULATORY_CARE_PROVIDER_SITE_OTHER): Payer: BLUE CROSS/BLUE SHIELD

## 2019-08-30 ENCOUNTER — Other Ambulatory Visit: Payer: Self-pay

## 2019-08-30 DIAGNOSIS — J309 Allergic rhinitis, unspecified: Secondary | ICD-10-CM

## 2019-09-01 ENCOUNTER — Other Ambulatory Visit: Payer: Self-pay

## 2019-09-01 ENCOUNTER — Ambulatory Visit (INDEPENDENT_AMBULATORY_CARE_PROVIDER_SITE_OTHER): Payer: BLUE CROSS/BLUE SHIELD

## 2019-09-01 DIAGNOSIS — J309 Allergic rhinitis, unspecified: Secondary | ICD-10-CM

## 2019-09-13 ENCOUNTER — Ambulatory Visit (INDEPENDENT_AMBULATORY_CARE_PROVIDER_SITE_OTHER): Payer: BLUE CROSS/BLUE SHIELD

## 2019-09-13 ENCOUNTER — Other Ambulatory Visit: Payer: Self-pay

## 2019-09-13 DIAGNOSIS — J309 Allergic rhinitis, unspecified: Secondary | ICD-10-CM | POA: Diagnosis not present

## 2019-09-15 ENCOUNTER — Other Ambulatory Visit: Payer: Self-pay

## 2019-09-15 ENCOUNTER — Ambulatory Visit (INDEPENDENT_AMBULATORY_CARE_PROVIDER_SITE_OTHER): Payer: BLUE CROSS/BLUE SHIELD

## 2019-09-15 DIAGNOSIS — J309 Allergic rhinitis, unspecified: Secondary | ICD-10-CM

## 2019-09-20 ENCOUNTER — Other Ambulatory Visit: Payer: Self-pay

## 2019-09-20 ENCOUNTER — Ambulatory Visit (INDEPENDENT_AMBULATORY_CARE_PROVIDER_SITE_OTHER): Payer: BLUE CROSS/BLUE SHIELD

## 2019-09-20 DIAGNOSIS — J309 Allergic rhinitis, unspecified: Secondary | ICD-10-CM | POA: Diagnosis not present

## 2019-09-22 ENCOUNTER — Other Ambulatory Visit: Payer: Self-pay

## 2019-09-22 ENCOUNTER — Ambulatory Visit (INDEPENDENT_AMBULATORY_CARE_PROVIDER_SITE_OTHER): Payer: BLUE CROSS/BLUE SHIELD

## 2019-09-22 DIAGNOSIS — J309 Allergic rhinitis, unspecified: Secondary | ICD-10-CM | POA: Diagnosis not present

## 2019-09-28 ENCOUNTER — Encounter: Payer: Self-pay | Admitting: Allergy

## 2019-09-28 DIAGNOSIS — R509 Fever, unspecified: Secondary | ICD-10-CM | POA: Diagnosis not present

## 2019-09-28 DIAGNOSIS — R109 Unspecified abdominal pain: Secondary | ICD-10-CM | POA: Diagnosis not present

## 2019-09-28 DIAGNOSIS — Z03818 Encounter for observation for suspected exposure to other biological agents ruled out: Secondary | ICD-10-CM | POA: Diagnosis not present

## 2019-09-28 DIAGNOSIS — Z1152 Encounter for screening for COVID-19: Secondary | ICD-10-CM | POA: Diagnosis not present

## 2019-09-28 DIAGNOSIS — R112 Nausea with vomiting, unspecified: Secondary | ICD-10-CM | POA: Diagnosis not present

## 2019-10-06 ENCOUNTER — Ambulatory Visit (INDEPENDENT_AMBULATORY_CARE_PROVIDER_SITE_OTHER): Payer: BLUE CROSS/BLUE SHIELD

## 2019-10-06 ENCOUNTER — Other Ambulatory Visit: Payer: Self-pay

## 2019-10-06 DIAGNOSIS — J309 Allergic rhinitis, unspecified: Secondary | ICD-10-CM

## 2019-10-12 NOTE — Progress Notes (Signed)
Follow Up Note  RE: Elizabeth Briggs MRN: [id number] DOB: 1985-09-18 Date of Office Visit: 10/13/2019  Referring provider: Nolene Ebbs Primary care provider: Jomarie Longs, PA-C  Chief Complaint: Urticaria (most recently. ) and Angioedema (since September 27, 2019)  History of Present Illness: I had the pleasure of seeing Elizabeth Briggs for a follow up visit at the [facility] on 10/13/2019. She is a 34 y.o. female, who is being followed for allergic rhinitis on AIT and eustachian tube dysfunction. Her previous allergy office visit was on 07/12/2019 with Dr. Selena Batten. Today is a regular follow up visit.  Patient had issues with diarrhea and vomiting on 09/27/19. On 09/29/19 patient went to urgent care due to facial swelling and still having vomiting/diarrhea. COVID-19 was negative. She was diagnosed with a viral GI bug and was doing well after the IVF, Tylenol and Zofran. No more diarrhea and vomiting afterwards. No sick contacts.   The next few days she woke up with hives and facial swelling x 1. She is still waking up with daily hives which resolve within a few hours and slowly improving.  Mainly occurs on her arms and legs. Describes them as pruritic, raised and erythema. Individual rashes lasts about few hours. No ecchymosis upon resolution. Associated symptoms include: none. Suspected triggers are unknown. Denies any fevers, chills, changes in medications, foods, personal care products. Patient does consume red meat 3 times per week. She had some recent tick bites. Reviewed images on the phone - consistent with hives.  Patient was concerned if her medications were causing this so now only takes zyrtec 10mg  daily.  Previous history of rash/hives: none.  Still having some indigestion - taking Pepcid 20mg  at night and Prilosec 20mg .   Patient thinks she may have had steak the night before the vomiting episode.   Assessment and Plan: Elizabeth Briggs is a 34 y.o. female  with: Acute urticaria Hives for the past 2 weeks. Started with nausea, vomiting and diarrhea. Thought to have a GI bug but no one else in the family had this issue. Had a steak the night before and eats red meat 3 times per week with recent tick bites. Denies any changes in diet, medications or personal care products.   Concerned about alpha-gal allergy given the above history.  Get alpha-gal bloodwork.  Avoid red meat - no beef, pork or lamb.  Start Zyrtec 10mg  daily to help with the hives.  Keep track of rash - take pictures.  Avoid the following potential triggers: alcohol, tight clothing, NSAIDs, hot showers and getting overheated. If hives persistent and alpha gal allergy is negative then will need to additional work up at that time.  Seasonal and perennial allergic rhinitis Past history - Rhinitis symptoms with ear pain and pressure for the past 9 months. Seen by ENT who referred her to allergy for testing. Tried Flonase, Azelastine, zyrtec and oral decongestants with some benefit. 2021 skin testing showed: Positive to grass, ragweed, weed, trees, dust mites, cat. Interim history - started AIT on 08/02/2019 (G-W-RW-T & DM-C-CR) with no issues.  Continue environmental control measures as below.  May take zyrtec 10mg  daily for allergies.   May use Flonase (fluticasone) nasal spray 1 spray per nostril twice a day as needed for nasal congestion.   May use azelastine nasal spray 1-2 sprays per nostril twice a day as needed for runny nose/drainage.  Nasal saline spray (i.e., Simply Saline) or nasal saline lavage (i.e., NeilMed) is  recommended as needed and prior to medicated nasal sprays.  Return next week for allergy injection.   Heartburn  Continue to take famotidine 20mg  daily.  Continue to take Prilosec 20mg  daily.  Return in about 4 months (around 02/12/2020).  Lab Orders     Alpha-Gal Panel  Diagnostics: None.  Medication List:  Current Outpatient Medications   Medication Sig Dispense Refill  . AUVI-Q 0.3 MG/0.3ML SOAJ injection Inject into the muscle as directed.    azelastine (ASTELIN) 0.1 % nasal spray Place 2 sprays into both nostrils 2 (two) times daily.    . calcium-vitamin D (OSCAL WITH D) 500-200 MG-UNIT tablet Take 1 tablet by mouth.    . Cetirizine HCl 10 MG CAPS Take 1 capsule by mouth daily.    . clobetasol ointment (TEMOVATE) 0.05 % Apply 1 application topically 2 (two) times daily. 30 g 0  . fluticasone (FLONASE) 50 MCG/ACT nasal spray Place 2 sprays into both nostrils daily. 16 g 6  . metroNIDAZOLE (METROCREAM) 0.75 % cream Apply topically 2 (two) times daily. Around mouth for perioral dermatitis 45 g 0  . montelukast (SINGULAIR) 10 MG tablet Take 1 tablet (10 mg total) by mouth at bedtime. 30 tablet 5  . omeprazole (PRILOSEC) 20 MG capsule Take 20 mg by mouth daily.    . ondansetron (ZOFRAN-ODT) 4 MG disintegrating tablet Take 4 mg by mouth every 6 (six) hours as needed.     No current facility-administered medications for this visit.   Allergies: No Known Allergies I reviewed her past medical history, social history, family history, and environmental history and no significant changes have been reported from her previous visit.  Review of Systems  Constitutional: Negative for appetite change, chills, fever and unexpected weight change.  HENT: Positive for congestion and postnasal drip. Negative for sneezing.   Eyes: Negative for itching.  Respiratory: Negative for cough, chest tightness, shortness of breath and wheezing.   Cardiovascular: Negative for chest pain.  Gastrointestinal: Negative for abdominal pain.  Genitourinary: Negative for difficulty urinating.  Skin: Positive for rash.  Allergic/Immunologic: Positive for environmental allergies.   Objective: BP 110/60   Pulse 87   Resp 18   Ht 5\' 3"  (1.6 m)   SpO2 98%   BMI 21.74 kg/m  Body mass index is 21.74 kg/m. Physical Exam Vitals and nursing note reviewed.   Constitutional:      Appearance: Normal appearance. She is well-developed.  HENT:     Head: Normocephalic and atraumatic.     Right Ear: External ear normal.     Left Ear: External ear normal.     Ears:     Comments: Left TM - blood behind TM due to recent myringotomy.    Nose: Nose normal.     Mouth/Throat:     Mouth: Mucous membranes are moist.     Pharynx: Oropharynx is clear.  Eyes:     Conjunctiva/sclera: Conjunctivae normal.  Cardiovascular:     Rate and Rhythm: Normal rate and regular rhythm.     Heart sounds: Normal heart sounds. No murmur heard.  No friction rub. No gallop.   Pulmonary:     Effort: Pulmonary effort is normal.     Breath sounds: Normal breath sounds. No wheezing, rhonchi or rales.  Abdominal:     Palpations: Abdomen is soft.  Musculoskeletal:     Cervical back: Neck supple.  Skin:    General: Skin is warm.     Findings: No rash.  Neurological:  Mental Status: She is alert and oriented to person, place, and time.  Psychiatric:        Mood and Affect: Mood normal.        Behavior: Behavior normal.    Previous notes and tests were reviewed. The plan was reviewed with the patient/family, and all questions/concerned were addressed.  It was my pleasure to see Aza today and participate in her care. Please feel free to contact me with any questions or concerns.  Sincerely,  Wyline Mood, DO Allergy & Immunology  [facility] office: [phone] [facility] office: [phone] [place] office: [phone]

## 2019-10-13 ENCOUNTER — Ambulatory Visit (INDEPENDENT_AMBULATORY_CARE_PROVIDER_SITE_OTHER): Payer: BLUE CROSS/BLUE SHIELD | Admitting: Allergy

## 2019-10-13 ENCOUNTER — Other Ambulatory Visit: Payer: Self-pay

## 2019-10-13 ENCOUNTER — Encounter: Payer: Self-pay | Admitting: Allergy

## 2019-10-13 VITALS — BP 110/60 | HR 87 | Resp 18 | Ht 63.0 in

## 2019-10-13 DIAGNOSIS — J302 Other seasonal allergic rhinitis: Secondary | ICD-10-CM

## 2019-10-13 DIAGNOSIS — H6983 Other specified disorders of Eustachian tube, bilateral: Secondary | ICD-10-CM

## 2019-10-13 DIAGNOSIS — J3089 Other allergic rhinitis: Secondary | ICD-10-CM | POA: Diagnosis not present

## 2019-10-13 DIAGNOSIS — L508 Other urticaria: Secondary | ICD-10-CM

## 2019-10-13 DIAGNOSIS — R12 Heartburn: Secondary | ICD-10-CM | POA: Diagnosis not present

## 2019-10-13 NOTE — Assessment & Plan Note (Signed)
   Continue to take famotidine 20mg  daily.  Continue to take Prilosec 20mg  daily.

## 2019-10-13 NOTE — Patient Instructions (Addendum)
Hives:  Start Zyrtec 10mg  daily to help with the hives.  Avid red meat - no beef, pork or lamb.  Get alpha-gal bloodwork.  Keep track of rash - take pictures.  Avoid the following potential triggers: alcohol, tight clothing, NSAIDs, hot showers and getting overheated.  Environmental allergies 2021 skin testing Positive to grass, ragweed, weed, trees, dust mites, cat.  Continue environmental control measures as below.  May take zyrtec 10mg  daily for allergies.   May use Flonase (fluticasone) nasal spray 1 spray per nostril twice a day as needed for nasal congestion.   May use azelastine nasal spray 1-2 sprays per nostril twice a day as needed for runny nose/drainage.  Nasal saline spray (i.e., Simply Saline) or nasal saline lavage (i.e., NeilMed) is recommended as needed and prior to medicated nasal sprays.  Return next week for allergy injection.   Reflux:  Continue to take famotidine 20mg  daily.  Continue to take Prilosec 20mg  daily.  Follow up in 4 months or sooner if needed.   Reducing Pollen Exposure  Pollen seasons: trees (spring), grass (summer) and ragweed/weeds (fall).  Keep windows closed in your home and car to lower pollen exposure.   Install air conditioning in the bedroom and throughout the house if possible.   Avoid going out in dry windy days - especially early morning.  Pollen counts are highest between 5 - 10 AM and on dry, hot and windy days.   Save outside activities for late afternoon or after a heavy rain, when pollen levels are lower.   Avoid mowing of grass if you have grass pollen allergy.  Be aware that pollen can also be transported indoors on people and pets.   Dry your clothes in an automatic dryer rather than hanging them outside where they might collect pollen.   Rinse hair and eyes before bedtime. Control of House Dust Mite Allergen  Dust mite allergens are a common trigger of allergy and asthma symptoms. While they can be found  throughout the house, these microscopic creatures thrive in warm, humid environments such as bedding, upholstered furniture and carpeting.  Because so much time is spent in the bedroom, it is essential to reduce mite levels there.   Encase pillows, mattresses, and box springs in special allergen-proof fabric covers or airtight, zippered plastic covers.   Bedding should be washed weekly in hot water (130 F) and dried in a hot dryer. Allergen-proof covers are available for comforters and pillows that cant be regularly washed.   Wash the allergy-proof covers every few months. Minimize clutter in the bedroom. Keep pets out of the bedroom.   Keep humidity less than 50% by using a dehumidifier or air conditioning. You can buy a humidity measuring device called a hygrometer to monitor this.   If possible, replace carpets with hardwood, linoleum, or washable area rugs. If that's not possible, vacuum frequently with a vacuum that has a HEPA filter.  Remove all upholstered furniture and non-washable window drapes from the bedroom.  Remove all non-washable stuffed toys from the bedroom.  Wash stuffed toys weekly. Pet Allergen Avoidance:  Contrary to popular opinion, there are no hypoallergenic breeds of dogs or cats. That is because people are not allergic to an animals hair, but to an allergen found in the animal's saliva, dander (dead skin flakes) or urine. Pet allergy symptoms typically occur within minutes. For some people, symptoms can build up and become most severe 8 to 12 hours after contact with the animal. People with severe  allergies can experience reactions in public places if dander has been transported on the pet owners clothing.  Keeping an animal outdoors is only a partial solution, since homes with pets in the yard still have higher concentrations of animal allergens.  Before getting a pet, ask your allergist to determine if you are allergic to animals. If your pet is already  considered part of your family, try to minimize contact and keep the pet out of the bedroom and other rooms where you spend a great deal of time.  As with dust mites, vacuum carpets often or replace carpet with a hardwood floor, tile or linoleum.  High-efficiency particulate air (HEPA) cleaners can reduce allergen levels over time.  While dander and saliva are the source of cat and dog allergens, urine is the source of allergens from rabbits, hamsters, mice and [place] pigs; so ask a non-allergic family member to clean the animals cage.  If you have a pet allergy, talk to your allergist about the potential for allergy immunotherapy (allergy shots). This strategy can often provide long-term relief.

## 2019-10-13 NOTE — Assessment & Plan Note (Addendum)
Past history - Rhinitis symptoms with ear pain and pressure for the past 9 months. Seen by ENT who referred her to allergy for testing. Tried Flonase, Azelastine, zyrtec and oral decongestants with some benefit. 2021 skin testing showed: Positive to grass, ragweed, weed, trees, dust mites, cat. Interim history - started AIT on 08/02/2019 (G-W-RW-T & DM-C-CR) with no issues.  Continue environmental control measures as below.  May take zyrtec 10mg  daily for allergies.   May use Flonase (fluticasone) nasal spray 1 spray per nostril twice a day as needed for nasal congestion.   May use azelastine nasal spray 1-2 sprays per nostril twice a day as needed for runny nose/drainage.  Nasal saline spray (i.e., Simply Saline) or nasal saline lavage (i.e., NeilMed) is recommended as needed and prior to medicated nasal sprays.  Return next week for allergy injection.

## 2019-10-13 NOTE — Assessment & Plan Note (Addendum)
Hives for the past 2 weeks. Started with nausea, vomiting and diarrhea. Thought to have a GI bug but no one else in the family had this issue. Had a steak the night before and eats red meat 3 times per week with recent tick bites. Denies any changes in diet, medications or personal care products.   Concerned about alpha-gal allergy given the above history.  Get alpha-gal bloodwork.  Avoid red meat - no beef, pork or lamb.  Start Zyrtec 10mg  daily to help with the hives.  Keep track of rash - take pictures.  Avoid the following potential triggers: alcohol, tight clothing, NSAIDs, hot showers and getting overheated. If hives persistent and alpha gal allergy is negative then will need to additional work up at that time.

## 2019-10-14 DIAGNOSIS — L508 Other urticaria: Secondary | ICD-10-CM | POA: Diagnosis not present

## 2019-10-20 ENCOUNTER — Other Ambulatory Visit: Payer: Self-pay

## 2019-10-20 ENCOUNTER — Ambulatory Visit (INDEPENDENT_AMBULATORY_CARE_PROVIDER_SITE_OTHER): Payer: BLUE CROSS/BLUE SHIELD

## 2019-10-20 DIAGNOSIS — J309 Allergic rhinitis, unspecified: Secondary | ICD-10-CM | POA: Diagnosis not present

## 2019-10-21 ENCOUNTER — Encounter: Payer: Self-pay | Admitting: Allergy

## 2019-10-21 LAB — ALPHA-GAL PANEL
Alpha Gal IgE*: <0.1 kU/L (ref <0.10)
Beef (Bos spp) IgE: <0.1 kU/L (ref <0.35)
Class Interpretation: 0
Class Interpretation: 0
Class Interpretation: 0
Lamb/Mutton (Ovis spp) IgE: <0.1 kU/L (ref <0.35)
Pork (Sus spp) IgE: <0.1 kU/L (ref <0.35)

## 2019-10-27 ENCOUNTER — Ambulatory Visit (INDEPENDENT_AMBULATORY_CARE_PROVIDER_SITE_OTHER): Payer: BLUE CROSS/BLUE SHIELD

## 2019-10-27 ENCOUNTER — Other Ambulatory Visit: Payer: Self-pay

## 2019-10-27 DIAGNOSIS — J309 Allergic rhinitis, unspecified: Secondary | ICD-10-CM | POA: Diagnosis not present

## 2019-10-30 ENCOUNTER — Other Ambulatory Visit: Payer: Self-pay | Admitting: Physician Assistant

## 2019-10-30 DIAGNOSIS — H6983 Other specified disorders of Eustachian tube, bilateral: Secondary | ICD-10-CM

## 2019-11-03 ENCOUNTER — Other Ambulatory Visit: Payer: Self-pay

## 2019-11-03 ENCOUNTER — Ambulatory Visit (INDEPENDENT_AMBULATORY_CARE_PROVIDER_SITE_OTHER): Payer: BLUE CROSS/BLUE SHIELD

## 2019-11-03 DIAGNOSIS — J309 Allergic rhinitis, unspecified: Secondary | ICD-10-CM | POA: Diagnosis not present

## 2019-11-08 ENCOUNTER — Ambulatory Visit (INDEPENDENT_AMBULATORY_CARE_PROVIDER_SITE_OTHER): Payer: BLUE CROSS/BLUE SHIELD

## 2019-11-08 ENCOUNTER — Other Ambulatory Visit: Payer: Self-pay

## 2019-11-08 DIAGNOSIS — J309 Allergic rhinitis, unspecified: Secondary | ICD-10-CM | POA: Diagnosis not present

## 2019-11-15 ENCOUNTER — Ambulatory Visit (INDEPENDENT_AMBULATORY_CARE_PROVIDER_SITE_OTHER): Payer: BLUE CROSS/BLUE SHIELD

## 2019-11-15 ENCOUNTER — Other Ambulatory Visit: Payer: Self-pay

## 2019-11-15 DIAGNOSIS — J309 Allergic rhinitis, unspecified: Secondary | ICD-10-CM | POA: Diagnosis not present

## 2019-11-22 ENCOUNTER — Other Ambulatory Visit: Payer: Self-pay

## 2019-11-22 ENCOUNTER — Ambulatory Visit (INDEPENDENT_AMBULATORY_CARE_PROVIDER_SITE_OTHER): Payer: BLUE CROSS/BLUE SHIELD

## 2019-11-22 DIAGNOSIS — J309 Allergic rhinitis, unspecified: Secondary | ICD-10-CM

## 2019-11-24 ENCOUNTER — Other Ambulatory Visit: Payer: Self-pay

## 2019-11-24 ENCOUNTER — Ambulatory Visit (INDEPENDENT_AMBULATORY_CARE_PROVIDER_SITE_OTHER): Payer: BLUE CROSS/BLUE SHIELD

## 2019-11-24 DIAGNOSIS — J309 Allergic rhinitis, unspecified: Secondary | ICD-10-CM

## 2019-12-08 ENCOUNTER — Other Ambulatory Visit: Payer: Self-pay

## 2019-12-08 ENCOUNTER — Ambulatory Visit (INDEPENDENT_AMBULATORY_CARE_PROVIDER_SITE_OTHER): Payer: BLUE CROSS/BLUE SHIELD

## 2019-12-08 DIAGNOSIS — J309 Allergic rhinitis, unspecified: Secondary | ICD-10-CM | POA: Diagnosis not present

## 2019-12-13 ENCOUNTER — Ambulatory Visit (INDEPENDENT_AMBULATORY_CARE_PROVIDER_SITE_OTHER): Payer: BLUE CROSS/BLUE SHIELD

## 2019-12-13 ENCOUNTER — Other Ambulatory Visit: Payer: Self-pay

## 2019-12-13 DIAGNOSIS — J309 Allergic rhinitis, unspecified: Secondary | ICD-10-CM | POA: Diagnosis not present

## 2019-12-20 ENCOUNTER — Ambulatory Visit (INDEPENDENT_AMBULATORY_CARE_PROVIDER_SITE_OTHER): Payer: BLUE CROSS/BLUE SHIELD

## 2019-12-20 ENCOUNTER — Other Ambulatory Visit: Payer: Self-pay

## 2019-12-20 DIAGNOSIS — J309 Allergic rhinitis, unspecified: Secondary | ICD-10-CM | POA: Diagnosis not present

## 2019-12-22 ENCOUNTER — Other Ambulatory Visit (INDEPENDENT_AMBULATORY_CARE_PROVIDER_SITE_OTHER): Payer: Self-pay | Admitting: Otolaryngology

## 2019-12-29 ENCOUNTER — Other Ambulatory Visit: Payer: Self-pay

## 2019-12-29 ENCOUNTER — Ambulatory Visit (INDEPENDENT_AMBULATORY_CARE_PROVIDER_SITE_OTHER): Payer: BC Managed Care – PPO

## 2019-12-29 DIAGNOSIS — J309 Allergic rhinitis, unspecified: Secondary | ICD-10-CM

## 2020-01-03 ENCOUNTER — Other Ambulatory Visit: Payer: Self-pay

## 2020-01-03 ENCOUNTER — Ambulatory Visit (INDEPENDENT_AMBULATORY_CARE_PROVIDER_SITE_OTHER): Payer: BC Managed Care – PPO

## 2020-01-03 DIAGNOSIS — J309 Allergic rhinitis, unspecified: Secondary | ICD-10-CM

## 2020-01-10 ENCOUNTER — Ambulatory Visit (INDEPENDENT_AMBULATORY_CARE_PROVIDER_SITE_OTHER): Payer: BC Managed Care – PPO

## 2020-01-10 ENCOUNTER — Other Ambulatory Visit: Payer: Self-pay

## 2020-01-10 DIAGNOSIS — J309 Allergic rhinitis, unspecified: Secondary | ICD-10-CM

## 2020-01-11 ENCOUNTER — Other Ambulatory Visit: Payer: Self-pay | Admitting: Allergy

## 2020-01-15 DIAGNOSIS — Z20822 Contact with and (suspected) exposure to covid-19: Secondary | ICD-10-CM | POA: Diagnosis not present

## 2020-01-31 ENCOUNTER — Ambulatory Visit (INDEPENDENT_AMBULATORY_CARE_PROVIDER_SITE_OTHER): Payer: BC Managed Care – PPO

## 2020-01-31 ENCOUNTER — Other Ambulatory Visit: Payer: Self-pay

## 2020-01-31 DIAGNOSIS — J309 Allergic rhinitis, unspecified: Secondary | ICD-10-CM

## 2020-02-07 ENCOUNTER — Ambulatory Visit (INDEPENDENT_AMBULATORY_CARE_PROVIDER_SITE_OTHER): Payer: BC Managed Care – PPO

## 2020-02-07 ENCOUNTER — Other Ambulatory Visit: Payer: Self-pay

## 2020-02-07 DIAGNOSIS — J309 Allergic rhinitis, unspecified: Secondary | ICD-10-CM

## 2020-02-14 ENCOUNTER — Other Ambulatory Visit: Payer: Self-pay | Admitting: Allergy

## 2020-02-14 ENCOUNTER — Ambulatory Visit: Payer: BLUE CROSS/BLUE SHIELD | Admitting: Allergy

## 2020-02-16 ENCOUNTER — Encounter: Payer: Self-pay | Admitting: Allergy

## 2020-02-16 ENCOUNTER — Ambulatory Visit (INDEPENDENT_AMBULATORY_CARE_PROVIDER_SITE_OTHER): Payer: BC Managed Care – PPO | Admitting: Allergy

## 2020-02-16 ENCOUNTER — Other Ambulatory Visit: Payer: Self-pay

## 2020-02-16 VITALS — BP 128/76 | HR 89 | Resp 16

## 2020-02-16 DIAGNOSIS — J302 Other seasonal allergic rhinitis: Secondary | ICD-10-CM

## 2020-02-16 DIAGNOSIS — R12 Heartburn: Secondary | ICD-10-CM

## 2020-02-16 DIAGNOSIS — J3089 Other allergic rhinitis: Secondary | ICD-10-CM

## 2020-02-16 NOTE — Progress Notes (Signed)
Follow Up Note  RE: [place] COHICK MRN: [id number] DOB: Jun 04, 1985 Date of Office Visit: 02/16/2020  Referring provider: Nolene Ebbs Primary care provider: Jomarie Longs, PA-C  Chief Complaint: Allergic Rhinitis  (Zyrtec stopped for a minute, headaches started again)  History of Present Illness: I had the pleasure of seeing Kaeya Schiffer for a follow up visit at the [facility] on 02/16/2020. She is a 35 y.o. female, who is being followed for urticaria, allergic rhinitis and heartburn. Her previous allergy office visit was on 10/13/2019 with Dr. Selena Batten. Today is a regular follow up visit.  Acute urticaria Hives resolved around 6 weeks and no reoccurrence since then.  Eating red meat without any issues.   Seasonal and perennial allergic rhinitis Tolerating allergy injections  Noticed some headaches when not taking zyrtec daily. Still taking Singulair, Flonase, azelastine with good benefit. No nosebleeds.   Heartburn Taking famotidine 20mg  1-2 times a day and Prilosec 20mg  daily with good benefit.   Assessment and Plan: Taneshia is a 35 y.o. female with: Seasonal and perennial allergic rhinitis Past history - Rhinitis symptoms with ear pain and pressure for the past 9 months. Seen by ENT who referred her to allergy for testing. Tried Flonase, Azelastine, zyrtec and oral decongestants with some benefit. 2021 skin testing showed: Positive to grass, ragweed, weed, trees, dust mites, cat. started AIT on 08/02/2019 (G-W-RW-T & DM-C-CR) Interim history - stable with below regimen. Headaches if misses zyrtec dose.  Continue environmental control measures as below.  Continue allergy injections - given today.   Continue Singulair (montelukast) 10mg  daily at night.  Continue zyrtec 10mg  daily.   May use Flonase (fluticasone) nasal spray 1 spray per nostril twice a day as needed for nasal congestion.   May use azelastine nasal spray 1-2 sprays per nostril twice a  day as needed for runny nose/drainage.  Nasal saline spray (i.e., Simply Saline) or nasal saline lavage (i.e., NeilMed) is recommended as needed and prior to medicated nasal sprays.  Heartburn  Continue to take famotidine 20mg  1-2 times a day.  Continue to take Prilosec 20mg  daily.  Consider GI evaluation for your reflux.   Return in about 6 months (around 08/15/2020).  Diagnostics: None.  Medication List:  Current Outpatient Medications  Medication Sig Dispense Refill  . AUVI-Q 0.3 MG/0.3ML SOAJ injection Inject into the muscle as directed.    10/03/2019 azelastine (ASTELIN) 0.1 % nasal spray USE 1 SPARAY AS DIRECTED TWICE A DAY 30 mL 5  . calcium-vitamin D (OSCAL WITH D) 500-200 MG-UNIT tablet Take 1 tablet by mouth.    . Cetirizine HCl 10 MG CAPS Take 1 capsule by mouth daily.    . clobetasol ointment (TEMOVATE) 0.05 % Apply 1 application topically 2 (two) times daily. 30 g 0  . famotidine (PEPCID) 10 MG tablet Take 10 mg by mouth 2 (two) times daily.    . fluticasone (FLONASE) 50 MCG/ACT nasal spray SPRAY 2 SPRAYS INTO EACH NOSTRIL EVERY DAY 48 mL 2  . montelukast (SINGULAIR) 10 MG tablet TAKE 1 TABLET BY MOUTH EVERYDAY AT BEDTIME 30 tablet 0  . omeprazole (PRILOSEC) 20 MG capsule Take 20 mg by mouth daily.     No current facility-administered medications for this visit.   Allergies: No Known Allergies I reviewed her past medical history, social history, family history, and environmental history and no significant changes have been reported from her previous visit.  Review of Systems  Constitutional: Negative for  appetite change, chills, fever and unexpected weight change.  HENT: Negative for congestion, postnasal drip and sneezing.   Eyes: Negative for itching.  Respiratory: Negative for cough, chest tightness, shortness of breath and wheezing.   Cardiovascular: Negative for chest pain.  Gastrointestinal: Negative for abdominal pain.  Genitourinary: Negative for difficulty  urinating.  Skin: Negative for rash.  Allergic/Immunologic: Positive for environmental allergies.  Neurological: Positive for headaches.   Objective: BP 128/76 (BP Location: Left Arm, Patient Position: Sitting, Cuff Size: Normal)   Pulse 89   Resp 16   SpO2 99%  There is no height or weight on file to calculate BMI. Physical Exam Vitals and nursing note reviewed.  Constitutional:      Appearance: Normal appearance. She is well-developed.  HENT:     Head: Normocephalic and atraumatic.     Right Ear: Tympanic membrane and external ear normal.     Left Ear: Tympanic membrane and external ear normal.     Nose: Nose normal.     Mouth/Throat:     Mouth: Mucous membranes are moist.     Pharynx: Oropharynx is clear.  Eyes:     Conjunctiva/sclera: Conjunctivae normal.  Cardiovascular:     Rate and Rhythm: Normal rate and regular rhythm.     Heart sounds: Normal heart sounds. No murmur heard. No friction rub. No gallop.   Pulmonary:     Effort: Pulmonary effort is normal.     Breath sounds: Normal breath sounds. No wheezing, rhonchi or rales.  Musculoskeletal:     Cervical back: Neck supple.  Skin:    General: Skin is warm.     Findings: No rash.  Neurological:     Mental Status: She is alert and oriented to person, place, and time.  Psychiatric:        Mood and Affect: Mood normal.        Behavior: Behavior normal.    Previous notes and tests were reviewed. The plan was reviewed with the patient/family, and all questions/concerned were addressed.  It was my pleasure to see Elizabeth Briggs today and participate in her care. Please feel free to contact me with any questions or concerns.  Sincerely,  Wyline Mood, DO Allergy & Immunology  [facility] office: [phone] [facility] office: [phone]

## 2020-02-16 NOTE — Assessment & Plan Note (Signed)
Past history - Rhinitis symptoms with ear pain and pressure for the past 9 months. Seen by ENT who referred her to allergy for testing. Tried Flonase, Azelastine, zyrtec and oral decongestants with some benefit. 2021 skin testing showed: Positive to grass, ragweed, weed, trees, dust mites, cat. started AIT on 08/02/2019 (G-W-RW-T & DM-C-CR) Interim history - stable with below regimen. Headaches if misses zyrtec dose.  Continue environmental control measures as below.  Continue allergy injections - given today.   Continue Singulair (montelukast) 10mg  daily at night.  Continue zyrtec 10mg  daily.   May use Flonase (fluticasone) nasal spray 1 spray per nostril twice a day as needed for nasal congestion.   May use azelastine nasal spray 1-2 sprays per nostril twice a day as needed for runny nose/drainage.  Nasal saline spray (i.e., Simply Saline) or nasal saline lavage (i.e., NeilMed) is recommended as needed and prior to medicated nasal sprays.

## 2020-02-16 NOTE — Assessment & Plan Note (Signed)
   Continue to take famotidine 20mg  1-2 times a day.  Continue to take Prilosec 20mg  daily.  Consider GI evaluation for your reflux.

## 2020-02-16 NOTE — Patient Instructions (Addendum)
Environmental allergies 2021 skin testing Positive to grass, ragweed, weed, trees, dust mites, cat.  Continue environmental control measures as below.  Continue allergy injections - given today.   Continue Singulair (montelukast) 10mg  daily at night.  Continue zyrtec 10mg  daily.   May use Flonase (fluticasone) nasal spray 1 spray per nostril twice a day as needed for nasal congestion.   May use azelastine nasal spray 1-2 sprays per nostril twice a day as needed for runny nose/drainage.  Nasal saline spray (i.e., Simply Saline) or nasal saline lavage (i.e., NeilMed) is recommended as needed and prior to medicated nasal sprays.  Reflux:  Continue to take famotidine 20mg  1-2 times a day.  Continue to take Prilosec 20mg  daily.  Consider GI evaluation for your reflux.   Follow up in 6 months or sooner if needed.   Reducing Pollen Exposure . Pollen seasons: trees (spring), grass (summer) and ragweed/weeds (fall). Keep windows closed in your home and car to lower pollen exposure.  air conditioning in the bedroom and throughout the house if possible.  . Avoid going out in dry windy days - especially early morning. . Pollen counts are highest between 5 - 10 AM and on dry, hot and windy days.  . Save outside activities for late afternoon or after a heavy rain, when pollen levels are lower.  . Avoid mowing of grass if you have grass pollen allergy. 02-01-1983 Be aware that pollen can also be transported indoors on people and pets.  . Dry your clothes in an automatic dryer rather than hanging them outside where they might collect pollen.  . Rinse hair and eyes before bedtime. Control of House Dust Mite Allergen . Dust mite allergens are a common trigger of allergy and asthma symptoms. While they can be found throughout the house, these microscopic creatures thrive in warm, humid environments such as bedding, upholstered furniture and carpeting. . Because so much time is spent in the  bedroom, it is essential to reduce mite levels there.  . Encase pillows, mattresses, and box springs in special allergen-proof fabric covers or airtight, zippered plastic covers.  . Bedding should be washed weekly in hot water (130 F) and dried in a hot dryer. Allergen-proof covers are available for comforters and pillows that can't be regularly washed.  06-05-1980 the allergy-proof covers every few months. Minimize clutter in the bedroom. Keep pets out of the bedroom.  Marland Kitchen Keep humidity less than 50% by using a dehumidifier or air conditioning. You can buy a humidity measuring device called a hygrometer to monitor this.  . If possible, replace carpets with hardwood, linoleum, or washable area rugs. If that's not possible, vacuum frequently with a vacuum that has a HEPA filter. . Remove all upholstered furniture and non-washable window drapes from the bedroom. . Remove all non-washable stuffed toys from the bedroom.  Wash stuffed toys weekly. Pet Allergen Avoidance: . Contrary to popular opinion, there are no "hypoallergenic" breeds of dogs or cats. That is because people are not allergic to an animal's hair, but to an allergen found in the animal's saliva, dander (dead skin flakes) or urine. Pet allergy symptoms typically occur within minutes. For some people, symptoms can build up and become most severe 8 to 12 hours after contact with the animal. People with severe allergies can experience reactions in public places if dander has been transported on the pet owners' clothing. Lilian Kapur Keeping an animal outdoors is only a partial solution, since homes with pets in the  yard still have higher concentrations of animal allergens. . Before getting a pet, ask your allergist to determine if you are allergic to animals. If your pet is already considered part of your family, try to minimize contact and keep the pet out of the bedroom and other rooms where you spend a great deal of time. . As with dust mites, vacuum carpets  often or replace carpet with a hardwood floor, tile or linoleum. . High-efficiency particulate air (HEPA) cleaners can reduce allergen levels over time. . While dander and saliva are the source of cat and dog allergens, urine is the source of allergens from rabbits, hamsters, mice and [place] pigs; so ask a non-allergic family member to clean the animal's cage. . If you have a pet allergy, talk to your allergist about the potential for allergy immunotherapy (allergy shots). This strategy can often provide long-term relief.

## 2020-02-21 ENCOUNTER — Other Ambulatory Visit: Payer: Self-pay

## 2020-02-21 ENCOUNTER — Ambulatory Visit (INDEPENDENT_AMBULATORY_CARE_PROVIDER_SITE_OTHER): Payer: BC Managed Care – PPO

## 2020-02-21 DIAGNOSIS — J309 Allergic rhinitis, unspecified: Secondary | ICD-10-CM | POA: Diagnosis not present

## 2020-02-29 DIAGNOSIS — Z20822 Contact with and (suspected) exposure to covid-19: Secondary | ICD-10-CM | POA: Diagnosis not present

## 2020-03-06 ENCOUNTER — Ambulatory Visit (INDEPENDENT_AMBULATORY_CARE_PROVIDER_SITE_OTHER): Payer: BC Managed Care – PPO

## 2020-03-06 ENCOUNTER — Other Ambulatory Visit: Payer: Self-pay

## 2020-03-06 DIAGNOSIS — J309 Allergic rhinitis, unspecified: Secondary | ICD-10-CM

## 2020-03-07 ENCOUNTER — Other Ambulatory Visit: Payer: Self-pay | Admitting: Allergy

## 2020-03-15 ENCOUNTER — Ambulatory Visit (INDEPENDENT_AMBULATORY_CARE_PROVIDER_SITE_OTHER): Payer: BC Managed Care – PPO

## 2020-03-15 ENCOUNTER — Other Ambulatory Visit: Payer: Self-pay

## 2020-03-15 DIAGNOSIS — J309 Allergic rhinitis, unspecified: Secondary | ICD-10-CM

## 2020-03-20 ENCOUNTER — Ambulatory Visit (INDEPENDENT_AMBULATORY_CARE_PROVIDER_SITE_OTHER): Payer: BC Managed Care – PPO

## 2020-03-20 ENCOUNTER — Other Ambulatory Visit: Payer: Self-pay

## 2020-03-20 DIAGNOSIS — J309 Allergic rhinitis, unspecified: Secondary | ICD-10-CM | POA: Diagnosis not present

## 2020-03-22 ENCOUNTER — Ambulatory Visit (INDEPENDENT_AMBULATORY_CARE_PROVIDER_SITE_OTHER): Payer: BC Managed Care – PPO

## 2020-03-22 ENCOUNTER — Other Ambulatory Visit: Payer: Self-pay

## 2020-03-22 DIAGNOSIS — J309 Allergic rhinitis, unspecified: Secondary | ICD-10-CM

## 2020-03-27 ENCOUNTER — Other Ambulatory Visit: Payer: Self-pay

## 2020-03-27 ENCOUNTER — Ambulatory Visit (INDEPENDENT_AMBULATORY_CARE_PROVIDER_SITE_OTHER): Payer: BC Managed Care – PPO

## 2020-03-27 DIAGNOSIS — J309 Allergic rhinitis, unspecified: Secondary | ICD-10-CM

## 2020-04-03 ENCOUNTER — Encounter: Payer: Self-pay | Admitting: Physician Assistant

## 2020-04-03 ENCOUNTER — Other Ambulatory Visit: Payer: Self-pay

## 2020-04-03 ENCOUNTER — Ambulatory Visit: Payer: BC Managed Care – PPO | Admitting: Physician Assistant

## 2020-04-03 VITALS — BP 127/77 | HR 102 | Ht 63.0 in | Wt 123.0 lb

## 2020-04-03 DIAGNOSIS — J358 Other chronic diseases of tonsils and adenoids: Secondary | ICD-10-CM | POA: Diagnosis not present

## 2020-04-03 NOTE — Progress Notes (Addendum)
   Subjective:    Patient ID: Elizabeth Briggs, female    DOB: 01-Apr-1985, 35 y.o.   MRN: [id number]  HPI  Pt is a 35 yo female who presents to the clinic to discuss 2 white spots on her left tonsil since November 2021. Denies any pain, swelling, irritation. They do not scrap off and do not drain. Denies any reflux or abdominal pain. No ST, nausea or vomiting.   .. Active Ambulatory Problems    Diagnosis Date Noted  . Seasonal and perennial allergic rhinitis 10/12/2015  . Dysfunction of both eustachian tubes 03/11/2019  . Perioral dermatitis 05/10/2019  . Heartburn 10/13/2019  . Tonsillar cyst 04/03/2020   Resolved Ambulatory Problems    Diagnosis Date Noted  . Well woman exam with routine gynecological exam 01/23/2014  . Family planning, natural methods to avoid pregnancy 01/23/2014  . Supervision of normal pregnancy 09/08/2014  . GBS (group B Streptococcus carrier), +RV culture, currently pregnant 04/03/2015  . Post term pregnancy over 40 weeks 04/29/2015  . SVD (spontaneous vaginal delivery) 05/02/2015  . Acute urticaria 10/13/2019   Past Medical History:  Diagnosis Date  . Abnormal Pap smear of cervix 2004  . Allergies   . Eczema      Review of Systems  All other systems reviewed and are negative.      Objective:   Physical Exam Vitals reviewed.  Constitutional:      Appearance: Normal appearance.  HENT:     Mouth/Throat:     Comments: Left tonsillar cyst 12 oclock and 3oclock.  No surrounding swelling.  Uvula midline.  Eyes:     Conjunctiva/sclera: Conjunctivae normal.  Cardiovascular:     Rate and Rhythm: Normal rate.     Pulses: Normal pulses.  Lymphadenopathy:     Cervical: No cervical adenopathy.  Neurological:     General: No focal deficit present.     Mental Status: She is alert.  Psychiatric:        Mood and Affect: Mood normal.           Assessment & Plan:  Marland KitchenMarland KitchenTheresia was seen today for mouth lesions.  Diagnoses and all orders for this  visit:  Tonsillar cyst   No concerns. Not causing any symptoms. No treatment needed. Follow up with any concerns or changes.

## 2020-04-05 ENCOUNTER — Ambulatory Visit (INDEPENDENT_AMBULATORY_CARE_PROVIDER_SITE_OTHER): Payer: BC Managed Care – PPO

## 2020-04-05 ENCOUNTER — Other Ambulatory Visit: Payer: Self-pay

## 2020-04-05 DIAGNOSIS — J309 Allergic rhinitis, unspecified: Secondary | ICD-10-CM

## 2020-04-09 ENCOUNTER — Other Ambulatory Visit: Payer: Self-pay | Admitting: Allergy

## 2020-04-12 ENCOUNTER — Other Ambulatory Visit: Payer: Self-pay

## 2020-04-12 ENCOUNTER — Ambulatory Visit (INDEPENDENT_AMBULATORY_CARE_PROVIDER_SITE_OTHER): Payer: BC Managed Care – PPO

## 2020-04-12 DIAGNOSIS — J309 Allergic rhinitis, unspecified: Secondary | ICD-10-CM | POA: Diagnosis not present

## 2020-04-19 ENCOUNTER — Ambulatory Visit (INDEPENDENT_AMBULATORY_CARE_PROVIDER_SITE_OTHER): Payer: BC Managed Care – PPO

## 2020-04-19 ENCOUNTER — Other Ambulatory Visit: Payer: Self-pay

## 2020-04-19 DIAGNOSIS — J309 Allergic rhinitis, unspecified: Secondary | ICD-10-CM | POA: Diagnosis not present

## 2020-04-26 ENCOUNTER — Other Ambulatory Visit: Payer: Self-pay

## 2020-04-26 ENCOUNTER — Ambulatory Visit (INDEPENDENT_AMBULATORY_CARE_PROVIDER_SITE_OTHER): Payer: BC Managed Care – PPO

## 2020-04-26 DIAGNOSIS — J309 Allergic rhinitis, unspecified: Secondary | ICD-10-CM

## 2020-05-03 ENCOUNTER — Ambulatory Visit (INDEPENDENT_AMBULATORY_CARE_PROVIDER_SITE_OTHER): Payer: BC Managed Care – PPO

## 2020-05-03 ENCOUNTER — Other Ambulatory Visit: Payer: Self-pay

## 2020-05-03 DIAGNOSIS — J309 Allergic rhinitis, unspecified: Secondary | ICD-10-CM | POA: Diagnosis not present

## 2020-05-22 ENCOUNTER — Encounter: Payer: BC Managed Care – PPO | Admitting: Physician Assistant

## 2020-05-22 ENCOUNTER — Ambulatory Visit (INDEPENDENT_AMBULATORY_CARE_PROVIDER_SITE_OTHER): Payer: BC Managed Care – PPO

## 2020-05-22 ENCOUNTER — Other Ambulatory Visit: Payer: Self-pay

## 2020-05-22 DIAGNOSIS — J309 Allergic rhinitis, unspecified: Secondary | ICD-10-CM | POA: Diagnosis not present

## 2020-05-29 ENCOUNTER — Ambulatory Visit (INDEPENDENT_AMBULATORY_CARE_PROVIDER_SITE_OTHER): Payer: BC Managed Care – PPO

## 2020-05-29 ENCOUNTER — Other Ambulatory Visit: Payer: Self-pay

## 2020-05-29 DIAGNOSIS — J309 Allergic rhinitis, unspecified: Secondary | ICD-10-CM

## 2020-06-07 ENCOUNTER — Other Ambulatory Visit: Payer: Self-pay

## 2020-06-07 ENCOUNTER — Ambulatory Visit (INDEPENDENT_AMBULATORY_CARE_PROVIDER_SITE_OTHER): Payer: BC Managed Care – PPO

## 2020-06-07 DIAGNOSIS — J309 Allergic rhinitis, unspecified: Secondary | ICD-10-CM | POA: Diagnosis not present

## 2020-06-12 ENCOUNTER — Ambulatory Visit (INDEPENDENT_AMBULATORY_CARE_PROVIDER_SITE_OTHER): Payer: BC Managed Care – PPO

## 2020-06-12 ENCOUNTER — Other Ambulatory Visit: Payer: Self-pay

## 2020-06-12 DIAGNOSIS — J309 Allergic rhinitis, unspecified: Secondary | ICD-10-CM

## 2020-06-12 NOTE — Progress Notes (Signed)
VIALS EXP 06-12-21 

## 2020-06-13 DIAGNOSIS — J3089 Other allergic rhinitis: Secondary | ICD-10-CM | POA: Diagnosis not present

## 2020-06-19 ENCOUNTER — Other Ambulatory Visit: Payer: Self-pay

## 2020-06-19 ENCOUNTER — Ambulatory Visit (INDEPENDENT_AMBULATORY_CARE_PROVIDER_SITE_OTHER): Payer: BC Managed Care – PPO

## 2020-06-19 DIAGNOSIS — J309 Allergic rhinitis, unspecified: Secondary | ICD-10-CM | POA: Diagnosis not present

## 2020-06-28 ENCOUNTER — Other Ambulatory Visit: Payer: Self-pay

## 2020-06-28 ENCOUNTER — Ambulatory Visit (INDEPENDENT_AMBULATORY_CARE_PROVIDER_SITE_OTHER): Payer: BC Managed Care – PPO | Admitting: *Deleted

## 2020-06-28 DIAGNOSIS — J309 Allergic rhinitis, unspecified: Secondary | ICD-10-CM | POA: Diagnosis not present

## 2020-07-05 ENCOUNTER — Ambulatory Visit (INDEPENDENT_AMBULATORY_CARE_PROVIDER_SITE_OTHER): Payer: BC Managed Care – PPO

## 2020-07-05 ENCOUNTER — Other Ambulatory Visit: Payer: Self-pay | Admitting: Allergy

## 2020-07-05 ENCOUNTER — Other Ambulatory Visit: Payer: Self-pay

## 2020-07-05 DIAGNOSIS — J309 Allergic rhinitis, unspecified: Secondary | ICD-10-CM

## 2020-07-10 ENCOUNTER — Other Ambulatory Visit: Payer: Self-pay

## 2020-07-10 ENCOUNTER — Ambulatory Visit (INDEPENDENT_AMBULATORY_CARE_PROVIDER_SITE_OTHER): Payer: BC Managed Care – PPO

## 2020-07-10 DIAGNOSIS — J309 Allergic rhinitis, unspecified: Secondary | ICD-10-CM | POA: Diagnosis not present

## 2020-07-17 ENCOUNTER — Other Ambulatory Visit: Payer: Self-pay

## 2020-07-17 ENCOUNTER — Ambulatory Visit (INDEPENDENT_AMBULATORY_CARE_PROVIDER_SITE_OTHER): Payer: BC Managed Care – PPO

## 2020-07-17 DIAGNOSIS — J309 Allergic rhinitis, unspecified: Secondary | ICD-10-CM

## 2020-07-27 ENCOUNTER — Other Ambulatory Visit: Payer: Self-pay | Admitting: Allergy

## 2020-08-02 ENCOUNTER — Ambulatory Visit (INDEPENDENT_AMBULATORY_CARE_PROVIDER_SITE_OTHER): Payer: BC Managed Care – PPO

## 2020-08-02 ENCOUNTER — Other Ambulatory Visit: Payer: Self-pay

## 2020-08-02 DIAGNOSIS — J309 Allergic rhinitis, unspecified: Secondary | ICD-10-CM

## 2020-08-07 ENCOUNTER — Other Ambulatory Visit: Payer: Self-pay

## 2020-08-07 ENCOUNTER — Ambulatory Visit (INDEPENDENT_AMBULATORY_CARE_PROVIDER_SITE_OTHER): Payer: BC Managed Care – PPO

## 2020-08-07 DIAGNOSIS — J309 Allergic rhinitis, unspecified: Secondary | ICD-10-CM

## 2020-08-16 ENCOUNTER — Ambulatory Visit: Payer: BC Managed Care – PPO | Admitting: Allergy

## 2020-08-21 ENCOUNTER — Other Ambulatory Visit: Payer: Self-pay

## 2020-08-21 ENCOUNTER — Ambulatory Visit (INDEPENDENT_AMBULATORY_CARE_PROVIDER_SITE_OTHER): Payer: BC Managed Care – PPO

## 2020-08-21 DIAGNOSIS — J309 Allergic rhinitis, unspecified: Secondary | ICD-10-CM | POA: Diagnosis not present

## 2020-08-28 ENCOUNTER — Other Ambulatory Visit: Payer: Self-pay | Admitting: Allergy

## 2020-08-30 ENCOUNTER — Ambulatory Visit (INDEPENDENT_AMBULATORY_CARE_PROVIDER_SITE_OTHER): Payer: BC Managed Care – PPO

## 2020-08-30 ENCOUNTER — Other Ambulatory Visit: Payer: Self-pay

## 2020-08-30 DIAGNOSIS — J309 Allergic rhinitis, unspecified: Secondary | ICD-10-CM

## 2020-09-06 ENCOUNTER — Other Ambulatory Visit: Payer: Self-pay

## 2020-09-06 ENCOUNTER — Ambulatory Visit (INDEPENDENT_AMBULATORY_CARE_PROVIDER_SITE_OTHER): Payer: BC Managed Care – PPO | Admitting: Allergy

## 2020-09-06 VITALS — BP 114/70 | HR 81 | Resp 17 | Ht 62.75 in | Wt 126.0 lb

## 2020-09-06 DIAGNOSIS — H6983 Other specified disorders of Eustachian tube, bilateral: Secondary | ICD-10-CM

## 2020-09-06 DIAGNOSIS — R12 Heartburn: Secondary | ICD-10-CM

## 2020-09-06 DIAGNOSIS — J309 Allergic rhinitis, unspecified: Secondary | ICD-10-CM | POA: Diagnosis not present

## 2020-09-06 DIAGNOSIS — J302 Other seasonal allergic rhinitis: Secondary | ICD-10-CM

## 2020-09-06 MED ORDER — MONTELUKAST SODIUM 10 MG PO TABS: ORAL_TABLET | ORAL | 5 refills | Status: DC

## 2020-09-06 MED ORDER — AUVI-Q 0.3 MG/0.3ML IJ SOAJ: 0.3000 mg | INTRAMUSCULAR | 1 refills | Status: DC | PRN

## 2020-09-06 MED ORDER — FLUTICASONE PROPIONATE 50 MCG/ACT NA SUSP: NASAL | 5 refills | Status: DC

## 2020-09-06 NOTE — Assessment & Plan Note (Signed)
Only takes meds prn now.  See handout for lifestyle and dietary modifications.  May use famotidine 20mg  1-2 times a day as needed.  May use Prilosec 20mg  daily a day as needed.

## 2020-09-06 NOTE — Assessment & Plan Note (Signed)
   Use Flonase (fluticasone) nasal spray 2 sprays per nostril once a day as needed.  Try to wean down to 1 spray per nostril once a day.  . May take some over the counter sudafed (decongestant) for a few days as needed. . If no improvement will refer to ENT again.

## 2020-09-06 NOTE — Assessment & Plan Note (Signed)
Past history - Rhinitis symptoms with ear pain and pressure for the past 9 months. Seen by ENT who referred her to allergy for testing. Tried Flonase, Azelastine, zyrtec and oral decongestants with some benefit. 2021 skin testing showed: Positive to grass, ragweed, weed, trees, dust mites, cat. started AIT on 08/02/2019 (G-W-RW-T & DM-C-CR) Interim history - unable to wean off Flonase but able to stop azelastine.  Continue environmental control measures as below.  Continue allergy injections - given today.   Try to stop Singulair first and if no issues then only use zyrtec and Singulair as needed.  Use over the counter antihistamines such as Zyrtec (cetirizine), Claritin (loratadine), Allegra (fexofenadine), or Xyzal (levocetirizine) daily as needed. May take twice a day during allergy flares. May switch antihistamines every few months.  Use Flonase (fluticasone) nasal spray 2 sprays per nostril once a day as needed for nasal congestion.   Try to wean down to 1 spray per nostril once a day.   Nasal saline spray (i.e., Simply Saline) or nasal saline lavage (i.e., NeilMed) is recommended as needed and prior to medicated nasal sprays.

## 2020-09-06 NOTE — Patient Instructions (Addendum)
Environmental allergies 2021 skin testing Positive to grass, ragweed, weed, trees, dust mites, cat. Continue environmental control measures as below. Continue allergy injections - given today.  Try to stop Singulair first and if no issues only use zyrtec and Singulair as needed.  Use over the counter antihistamines such as Zyrtec (cetirizine), Claritin (loratadine), Allegra (fexofenadine), or Xyzal (levocetirizine) daily as needed. May take twice a day during allergy flares. May switch antihistamines every few months. Use Flonase (fluticasone) nasal spray 2 sprays per nostril once a day as needed for nasal congestion.  Try to wean down to 1 spray per nostril once a day.  Nasal saline spray (i.e., Simply Saline) or nasal saline lavage (i.e., NeilMed) is recommended as needed and prior to medicated nasal sprays.  Ears: May take some over the counter sudafed (decongestant) for a few days as needed.  Reflux: See handout for lifestyle and dietary modifications. May use famotidine 20mg  1-2 times a day as needed. May use Prilosec 20mg  daily a day as needed.   Follow up in 6 months or sooner if needed.   Reducing Pollen Exposure Pollen seasons: trees (spring), grass (summer) and ragweed/weeds (fall). Keep windows closed in your home and car to lower pollen exposure.  Install air conditioning in the bedroom and throughout the house if possible.  Avoid going out in dry windy days - especially early morning. Pollen counts are highest between 5 - 10 AM and on dry, hot and windy days.  Save outside activities for late afternoon or after a heavy rain, when pollen levels are lower.  Avoid mowing of grass if you have grass pollen allergy. Be aware that pollen can also be transported indoors on people and pets.  Dry your clothes in an automatic dryer rather than hanging them outside where they might collect pollen.  Rinse hair and eyes before bedtime. Control of House Dust Mite Allergen Dust mite  allergens are a common trigger of allergy and asthma symptoms. While they can be found throughout the house, these microscopic creatures thrive in warm, humid environments such as bedding, upholstered furniture and carpeting. Because so much time is spent in the bedroom, it is essential to reduce mite levels there.  Encase pillows, mattresses, and box springs in special allergen-proof fabric covers or airtight, zippered plastic covers.  Bedding should be washed weekly in hot water (130 F) and dried in a hot dryer. Allergen-proof covers are available for comforters and pillows that can't be regularly washed.  Wash the allergy-proof covers every few months. Minimize clutter in the bedroom. Keep pets out of the bedroom.  Keep humidity less than 50% by using a dehumidifier or air conditioning. You can buy a humidity measuring device called a hygrometer to monitor this.  If possible, replace carpets with hardwood, linoleum, or washable area rugs. If that's not possible, vacuum frequently with a vacuum that has a HEPA filter. Remove all upholstered furniture and non-washable window drapes from the bedroom. Remove all non-washable stuffed toys from the bedroom.  Wash stuffed toys weekly. Pet Allergen Avoidance: Contrary to popular opinion, there are no "hypoallergenic" breeds of dogs or cats. That is because people are not allergic to an animal's hair, but to an allergen found in the animal's saliva, dander (dead skin flakes) or urine. Pet allergy symptoms typically occur within minutes. For some people, symptoms can build up and become most severe 8 to 12 hours after contact with the animal. People with severe allergies can experience reactions in public places if dander  has been transported on the pet owners' clothing. Keeping an animal outdoors is only a partial solution, since homes with pets in the yard still have higher concentrations of animal allergens. Before getting a pet, ask your allergist to  determine if you are allergic to animals. If your pet is already considered part of your family, try to minimize contact and keep the pet out of the bedroom and other rooms where you spend a great deal of time. As with dust mites, vacuum carpets often or replace carpet with a hardwood floor, tile or linoleum. High-efficiency particulate air (HEPA) cleaners can reduce allergen levels over time. While dander and saliva are the source of cat and dog allergens, urine is the source of allergens from rabbits, hamsters, mice and [place] pigs; so ask a non-allergic family member to clean the animal's cage. If you have a pet allergy, talk to your allergist about the potential for allergy immunotherapy (allergy shots). This strategy can often provide long-term relief.

## 2020-09-06 NOTE — Progress Notes (Signed)
Follow Up Note  RE: Elizabeth Briggs MRN: [id number] DOB: 06-30-1985 Date of Office Visit: 09/06/2020  Referring provider: Nolene Ebbs Primary care provider: Jomarie Longs, PA-C  Chief Complaint: Allergic Rhinitis   History of Present Illness: I had the pleasure of seeing [name] for a follow up visit at the [facility] on 09/06/2020. She is a 35 y.o. female, who is being followed for allergic rhinitis on AIT and heartburn. Her previous allergy office visit was on 02/16/2020 with Dr. Selena Batten. Today is a regular follow up visit.  Seasonal and perennial allergic rhinitis Currently on allergy injections with good benefit and noticing some improvement in symptoms already. She was able to wean off azelastine.  She tried to stop Flonase but she then gets the ear symptoms back with fullness, ringing and headaches. Currently on Flonase 2 sprays per nostril once a day.  Still taking Singulair at night and zyrtec 10mg  daily.  No symptom flare when missed a few doses in a row.    Heartburn Controlled and taking medications as needed only. Noticed improvement since dieting and eating less fried foods.   Assessment and Plan: [name] is a 35 y.o. female with: Seasonal and perennial allergic rhinitis Past history - Rhinitis symptoms with ear pain and pressure for the past 9 months. Seen by ENT who referred her to allergy for testing. Tried Flonase, Azelastine, zyrtec and oral decongestants with some benefit. 2021 skin testing showed: Positive to grass, ragweed, weed, trees, dust mites, cat. started AIT on 08/02/2019 (G-W-RW-T & DM-C-CR) Interim history - unable to wean off Flonase but able to stop azelastine. Continue environmental control measures as below. Continue allergy injections - given today.  Try to stop Singulair first and if no issues then only use zyrtec and Singulair as needed. Use over the counter antihistamines such as Zyrtec (cetirizine), Claritin  (loratadine), Allegra (fexofenadine), or Xyzal (levocetirizine) daily as needed. May take twice a day during allergy flares. May switch antihistamines every few months. Use Flonase (fluticasone) nasal spray 2 sprays per nostril once a day as needed for nasal congestion.  Try to wean down to 1 spray per nostril once a day.  Nasal saline spray (i.e., Simply Saline) or nasal saline lavage (i.e., NeilMed) is recommended as needed and prior to medicated nasal sprays.  Dysfunction of both eustachian tubes Use Flonase (fluticasone) nasal spray 2 sprays per nostril once a day as needed. Try to wean down to 1 spray per nostril once a day.  May take some over the counter sudafed (decongestant) for a few days as needed. If no improvement will refer to ENT again.  Heartburn Only takes meds prn now. See handout for lifestyle and dietary modifications. May use famotidine 20mg  1-2 times a day as needed. May use Prilosec 20mg  daily a day as needed.   Return in about 6 months (around 03/09/2021).  Meds ordered this encounter  Medications   AUVI-Q 0.3 MG/0.3ML SOAJ injection    Sig: Inject 0.3 mg into the muscle as needed for anaphylaxis.    Dispense:  1 each    Refill:  1    [phone] (H)   montelukast (SINGULAIR) 10 MG tablet    Sig: TAKE 1 TABLET BY MOUTH EVERYDAY AT BEDTIME    Dispense:  30 tablet    Refill:  5   fluticasone (FLONASE) 50 MCG/ACT nasal spray    Sig: Take 1-2 sprays per nostril once a day.    Dispense:  16 g    Refill:  5    Lab Orders  No laboratory test(s) ordered today    Diagnostics: None.   Medication List:  Current Outpatient Medications  Medication Sig Dispense Refill   calcium-vitamin D (OSCAL WITH D) 500-200 MG-UNIT tablet Take 1 tablet by mouth.     Cetirizine HCl 10 MG CAPS Take 1 capsule by mouth daily.     famotidine (PEPCID) 10 MG tablet Take 10 mg by mouth 2 (two) times daily.     fluticasone (FLONASE) 50 MCG/ACT nasal spray Take 1-2 sprays per  nostril once a day. 16 g 5   omeprazole (PRILOSEC) 20 MG capsule Take 20 mg by mouth daily.     AUVI-Q 0.3 MG/0.3ML SOAJ injection Inject 0.3 mg into the muscle as needed for anaphylaxis. 1 each 1   montelukast (SINGULAIR) 10 MG tablet TAKE 1 TABLET BY MOUTH EVERYDAY AT BEDTIME 30 tablet 5   No current facility-administered medications for this visit.   Allergies: No Known Allergies I reviewed her past medical history, social history, family history, and environmental history and no significant changes have been reported from her previous visit.  Review of Systems  Constitutional:  Negative for appetite change, chills, fever and unexpected weight change.  HENT:  Negative for congestion, postnasal drip and sneezing.        Ear fullness  Eyes:  Negative for itching.  Respiratory:  Negative for cough, chest tightness, shortness of breath and wheezing.   Cardiovascular:  Negative for chest pain.  Gastrointestinal:  Negative for abdominal pain.  Genitourinary:  Negative for difficulty urinating.  Skin:  Negative for rash.  Allergic/Immunologic: Positive for environmental allergies.  Neurological:  Positive for headaches.   Objective: BP 114/70   Pulse 81   Resp 17   Ht 5' 2.75" (1.594 m)   Wt 126 lb (57.2 kg)   SpO2 99%   BMI 22.50 kg/m  Body mass index is 22.5 kg/m. Physical Exam Vitals and nursing note reviewed.  Constitutional:      Appearance: Normal appearance. She is well-developed.  HENT:     Head: Normocephalic and atraumatic.     Right Ear: External ear normal.     Left Ear: Tympanic membrane and external ear normal.     Ears:     Comments: Some clear fluid behind TM. TM is non erythematous and not bulging.    Nose: Nose normal.     Mouth/Throat:     Mouth: Mucous membranes are moist.     Pharynx: Oropharynx is clear.  Eyes:     Conjunctiva/sclera: Conjunctivae normal.  Cardiovascular:     Rate and Rhythm: Normal rate and regular rhythm.     Heart sounds:  Normal heart sounds. No murmur heard.   No friction rub. No gallop.  Pulmonary:     Effort: Pulmonary effort is normal.     Breath sounds: Normal breath sounds. No wheezing, rhonchi or rales.  Musculoskeletal:     Cervical back: Neck supple.  Skin:    General: Skin is warm.     Findings: No rash.  Neurological:     Mental Status: She is alert and oriented to person, place, and time.  Psychiatric:        Mood and Affect: Mood normal.        Behavior: Behavior normal.   Previous notes and tests were reviewed. The plan was reviewed with the patient/family, and all questions/concerned were addressed.  It was my pleasure to  see Elizabeth Briggs today and participate in her care. Please feel free to contact me with any questions or concerns.  Sincerely,  Wyline Mood, DO Allergy & Immunology  [facility] office: [phone] [facility] office: [phone]

## 2020-09-18 ENCOUNTER — Other Ambulatory Visit: Payer: Self-pay

## 2020-09-18 ENCOUNTER — Ambulatory Visit (INDEPENDENT_AMBULATORY_CARE_PROVIDER_SITE_OTHER): Payer: BC Managed Care – PPO

## 2020-09-18 DIAGNOSIS — J309 Allergic rhinitis, unspecified: Secondary | ICD-10-CM

## 2020-09-25 ENCOUNTER — Ambulatory Visit (INDEPENDENT_AMBULATORY_CARE_PROVIDER_SITE_OTHER): Payer: BC Managed Care – PPO

## 2020-09-25 ENCOUNTER — Other Ambulatory Visit: Payer: Self-pay

## 2020-09-25 DIAGNOSIS — J309 Allergic rhinitis, unspecified: Secondary | ICD-10-CM

## 2020-10-02 ENCOUNTER — Ambulatory Visit (INDEPENDENT_AMBULATORY_CARE_PROVIDER_SITE_OTHER): Payer: BC Managed Care – PPO

## 2020-10-02 ENCOUNTER — Other Ambulatory Visit: Payer: Self-pay

## 2020-10-02 DIAGNOSIS — J309 Allergic rhinitis, unspecified: Secondary | ICD-10-CM | POA: Diagnosis not present

## 2020-10-09 ENCOUNTER — Ambulatory Visit (INDEPENDENT_AMBULATORY_CARE_PROVIDER_SITE_OTHER): Payer: BC Managed Care – PPO

## 2020-10-09 ENCOUNTER — Other Ambulatory Visit: Payer: Self-pay

## 2020-10-09 DIAGNOSIS — J309 Allergic rhinitis, unspecified: Secondary | ICD-10-CM

## 2020-10-23 ENCOUNTER — Other Ambulatory Visit: Payer: Self-pay

## 2020-10-23 ENCOUNTER — Ambulatory Visit (INDEPENDENT_AMBULATORY_CARE_PROVIDER_SITE_OTHER): Payer: BC Managed Care – PPO

## 2020-10-23 DIAGNOSIS — J309 Allergic rhinitis, unspecified: Secondary | ICD-10-CM

## 2020-11-02 DIAGNOSIS — J3089 Other allergic rhinitis: Secondary | ICD-10-CM | POA: Diagnosis not present

## 2020-11-02 NOTE — Progress Notes (Signed)
Exp 11/02/21

## 2020-11-06 ENCOUNTER — Ambulatory Visit (INDEPENDENT_AMBULATORY_CARE_PROVIDER_SITE_OTHER): Payer: BC Managed Care – PPO

## 2020-11-06 ENCOUNTER — Other Ambulatory Visit: Payer: Self-pay

## 2020-11-06 DIAGNOSIS — J309 Allergic rhinitis, unspecified: Secondary | ICD-10-CM

## 2020-11-22 ENCOUNTER — Ambulatory Visit (INDEPENDENT_AMBULATORY_CARE_PROVIDER_SITE_OTHER): Payer: BC Managed Care – PPO

## 2020-11-22 ENCOUNTER — Other Ambulatory Visit: Payer: Self-pay

## 2020-11-22 ENCOUNTER — Encounter: Payer: Self-pay | Admitting: Obstetrics & Gynecology

## 2020-11-22 ENCOUNTER — Ambulatory Visit (INDEPENDENT_AMBULATORY_CARE_PROVIDER_SITE_OTHER): Payer: BC Managed Care – PPO | Admitting: Obstetrics & Gynecology

## 2020-11-22 VITALS — BP 127/88 | HR 92 | Ht 62.0 in | Wt 122.0 lb

## 2020-11-22 DIAGNOSIS — N930 Postcoital and contact bleeding: Secondary | ICD-10-CM

## 2020-11-22 DIAGNOSIS — N86 Erosion and ectropion of cervix uteri: Secondary | ICD-10-CM

## 2020-11-22 DIAGNOSIS — J309 Allergic rhinitis, unspecified: Secondary | ICD-10-CM | POA: Diagnosis not present

## 2020-11-22 NOTE — Progress Notes (Signed)
   GYNECOLOGY OFFICE VISIT NOTE  History:   Elizabeth Briggs is a 35 y.o. G1P1001 here today for evaluation of episode of significant bleeding that occurred five days ago during intercourse. This occurred during digital penetration by her husband, he immediately notice significant blood on his hand. Patient had no pain. Bleeding stopped after a few minutes but was significant at the time of occurrence.  She reports having other episodes of noticing bleeding while wiping after intercourse, not as significant as what happened five days ago. She denies any current abnormal vaginal discharge, bleeding, pelvic pain or other concerns.    Past Medical History:  Diagnosis Date   Abnormal Pap smear of cervix 2004   Allergies    Eczema     Past Surgical History:  Procedure Laterality Date   COLPOSCOPY     MOUTH SURGERY     TYMPANOSTOMY TUBE PLACEMENT     The following portions of the patient's history were reviewed and updated as appropriate: allergies, current medications, past family history, past medical history, past social history, past surgical history and problem list.   Health Maintenance:  Normal pap and negative HRHPV on 06/28/2019.  Review of Systems:  Pertinent items noted in HPI and remainder of comprehensive ROS otherwise negative.  Physical Exam:  BP 127/88   Pulse 92   Ht 5\' 2"  (1.575 m)   Wt 122 lb (55.3 kg)   LMP 11/08/2020   BMI 22.31 kg/m  CONSTITUTIONAL: Well-developed, well-nourished female in no acute distress.  HEENT:  Normocephalic, atraumatic. External right and left ear normal. No scleral icterus.  NECK: Normal range of motion, supple, no masses noted on observation SKIN: No rash noted. Not diaphoretic. No erythema. No pallor. MUSCULOSKELETAL: Normal range of motion. No edema noted. NEUROLOGIC: Alert and oriented to person, place, and time. Normal muscle tone coordination. No cranial nerve deficit noted. PSYCHIATRIC: Normal mood and affect. Normal behavior. Normal  judgment and thought content. CARDIOVASCULAR: Normal heart rate noted RESPIRATORY: Effort and breath sounds normal, no problems with respiration noted ABDOMEN: No masses noted. No other overt distention noted.   PELVIC: Normal appearing external genitalia; normal urethral meatus; normal appearing vaginal mucosa without lacerations.  No abnormal discharge noted.  1 cm wide cervical ectropion noted with a healing laceration noted at superior part of ectropion. Performed in the presence of a chaperone.  Physical Exam Genitourinary:        Assessment and Plan:     1. Bleeding during intercourse 2. Cervical ectropion Patient reassured about findings of cervical ectropion during examination, also discussed that healing laceration was most likely cause of significant bleeding noted 5 days ago.  No other anomalies noted. If bleeding continues or worsens, may consider cryotherapy for treatment. All questions answered.  Routine preventative health maintenance measures emphasized. Please refer to After Visit Summary for other counseling recommendations.   Return for any gynecologic concerns.    I spent 20 minutes dedicated to the care of this patient including pre-visit review of records, face to face time with the patient discussing her conditions and treatments and post visit orders.    11/10/2020, MD, FACOG Obstetrician & Gynecologist, [facility] for [facility]

## 2020-12-03 DIAGNOSIS — H6993 Unspecified Eustachian tube disorder, bilateral: Secondary | ICD-10-CM | POA: Diagnosis not present

## 2020-12-03 DIAGNOSIS — R2232 Localized swelling, mass and lump, left upper limb: Secondary | ICD-10-CM | POA: Diagnosis not present

## 2020-12-04 ENCOUNTER — Ambulatory Visit (INDEPENDENT_AMBULATORY_CARE_PROVIDER_SITE_OTHER): Payer: BC Managed Care – PPO

## 2020-12-04 ENCOUNTER — Other Ambulatory Visit: Payer: Self-pay

## 2020-12-04 DIAGNOSIS — J309 Allergic rhinitis, unspecified: Secondary | ICD-10-CM | POA: Diagnosis not present

## 2020-12-13 ENCOUNTER — Other Ambulatory Visit: Payer: Self-pay

## 2020-12-13 ENCOUNTER — Ambulatory Visit (INDEPENDENT_AMBULATORY_CARE_PROVIDER_SITE_OTHER): Payer: BC Managed Care – PPO

## 2020-12-13 DIAGNOSIS — J309 Allergic rhinitis, unspecified: Secondary | ICD-10-CM

## 2020-12-14 DIAGNOSIS — R2232 Localized swelling, mass and lump, left upper limb: Secondary | ICD-10-CM | POA: Diagnosis not present

## 2020-12-27 ENCOUNTER — Other Ambulatory Visit: Payer: Self-pay

## 2020-12-27 ENCOUNTER — Ambulatory Visit (INDEPENDENT_AMBULATORY_CARE_PROVIDER_SITE_OTHER): Payer: BC Managed Care – PPO

## 2020-12-27 DIAGNOSIS — J309 Allergic rhinitis, unspecified: Secondary | ICD-10-CM

## 2021-01-01 ENCOUNTER — Other Ambulatory Visit: Payer: Self-pay

## 2021-01-01 ENCOUNTER — Ambulatory Visit (INDEPENDENT_AMBULATORY_CARE_PROVIDER_SITE_OTHER): Payer: BC Managed Care – PPO

## 2021-01-01 DIAGNOSIS — J309 Allergic rhinitis, unspecified: Secondary | ICD-10-CM | POA: Diagnosis not present

## 2021-01-17 ENCOUNTER — Other Ambulatory Visit: Payer: Self-pay

## 2021-01-17 ENCOUNTER — Ambulatory Visit (INDEPENDENT_AMBULATORY_CARE_PROVIDER_SITE_OTHER): Payer: BC Managed Care – PPO

## 2021-01-17 DIAGNOSIS — J309 Allergic rhinitis, unspecified: Secondary | ICD-10-CM | POA: Diagnosis not present

## 2021-01-24 ENCOUNTER — Other Ambulatory Visit: Payer: Self-pay

## 2021-01-24 ENCOUNTER — Ambulatory Visit (INDEPENDENT_AMBULATORY_CARE_PROVIDER_SITE_OTHER): Payer: BC Managed Care – PPO

## 2021-01-24 DIAGNOSIS — J309 Allergic rhinitis, unspecified: Secondary | ICD-10-CM | POA: Diagnosis not present

## 2021-02-05 ENCOUNTER — Other Ambulatory Visit: Payer: Self-pay

## 2021-02-05 ENCOUNTER — Ambulatory Visit (INDEPENDENT_AMBULATORY_CARE_PROVIDER_SITE_OTHER): Payer: BC Managed Care – PPO

## 2021-02-05 DIAGNOSIS — J309 Allergic rhinitis, unspecified: Secondary | ICD-10-CM | POA: Diagnosis not present

## 2021-02-19 ENCOUNTER — Other Ambulatory Visit: Payer: Self-pay

## 2021-02-19 ENCOUNTER — Ambulatory Visit (INDEPENDENT_AMBULATORY_CARE_PROVIDER_SITE_OTHER): Payer: BC Managed Care – PPO | Admitting: *Deleted

## 2021-02-19 DIAGNOSIS — J309 Allergic rhinitis, unspecified: Secondary | ICD-10-CM | POA: Diagnosis not present

## 2021-02-26 ENCOUNTER — Other Ambulatory Visit: Payer: Self-pay

## 2021-02-26 ENCOUNTER — Ambulatory Visit (INDEPENDENT_AMBULATORY_CARE_PROVIDER_SITE_OTHER): Payer: BC Managed Care – PPO

## 2021-02-26 DIAGNOSIS — J309 Allergic rhinitis, unspecified: Secondary | ICD-10-CM

## 2021-03-05 ENCOUNTER — Ambulatory Visit (INDEPENDENT_AMBULATORY_CARE_PROVIDER_SITE_OTHER): Payer: BC Managed Care – PPO

## 2021-03-05 ENCOUNTER — Other Ambulatory Visit: Payer: Self-pay

## 2021-03-05 DIAGNOSIS — J309 Allergic rhinitis, unspecified: Secondary | ICD-10-CM | POA: Diagnosis not present

## 2021-03-11 NOTE — Progress Notes (Signed)
Follow Up Note  RE: Elizabeth Briggs MRN: [id number] DOB: 03/04/1985 Date of Office Visit: 03/12/2021  Referring provider: Nolene Ebbs Primary care provider: Jomarie Longs, PA-C  Chief Complaint: Allergies (Still head congestion, ears crackling, tried getting Flonase nothing happened)  History of Present Illness: I had the pleasure of seeing Elizabeth Briggs for a follow up visit at the [facility] on 03/12/2021. She is a 36 y.o. female, who is being followed for allergic rhinitis on AIT, eustachian tube dysfunction and GERD. Her previous allergy office visit was on 09/06/2020 with Dr. Selena Batten. Today is a regular follow up visit.  Seasonal and perennial allergic rhinitis Having localized reactions with the injections. It usually lasts for 2 days. No other symptoms.  Currently taking zyrtec 10mg  in the morning.  Stopped Singulair and not sure if symptoms worsened since off the medication.  Currently taking Flonase 2 sprays daily. No nosebleeds.   Still having ear fullness, sinus pressure.    Heartburn Only takes meds prn now. No issues for 1-2 months now.   Assessment and Plan: Elizabeth Briggs is a 36 y.o. female with: Seasonal and perennial allergic rhinitis Past history - Rhinitis symptoms with ear pain and pressure for the past 9 months. Seen by ENT who referred her to allergy for testing. Tried Flonase, Azelastine, zyrtec and oral decongestants with some benefit. 2021 skin testing showed: Positive to grass, ragweed, weed, trees, dust mites, cat. started AIT on 08/02/2019 (G-W-RW-T & DM-C-CR) Interim history - large localized reaction only lasting 1-2 days, denies any other associated symptoms. Ear fullness still present. Continue environmental control measures.  Continue allergy injections (decreased to red 0.83mL) - given today.  Double up on the antihistamines the day before, the day of and the day after injection. You can take Zyrtec, Claritin, Allegra or Xyzal.   Take Singulair 10mg  the day before the injection. May use topical hydrocortisone cream as needed on the rash.  Start Ryaltris (olopatadine + mometasone nasal spray combination) 1-2 sprays per nostril twice a day. Sample given. This replaces Flonase. Let 32m know if too expensive. Nasal saline spray (i.e., Simply Saline) or nasal saline lavage (i.e., NeilMed) is recommended as needed and prior to medicated nasal sprays.  Dysfunction of both eustachian tubes Unchanged. Recommend ENT evaluation again.  Heartburn Continue lifestyle and dietary modifications. May use famotidine 20mg  1-2 times a day as needed. May use Prilosec 20mg  daily a day as needed.   Return in about 6 months (around 09/09/2021).  Meds ordered this encounter  Medications   Olopatadine-Mometasone (RYALTRIS) 665-25 MCG/ACT SUSP    Sig: Place 1-2 sprays into the nose in the morning and at bedtime.    Dispense:  29 g    Refill:  5    [phone]   Lab Orders  No laboratory test(s) ordered today    Diagnostics: None.   Medication List:  Current Outpatient Medications  Medication Sig Dispense Refill   AUVI-Q 0.3 MG/0.3ML SOAJ injection Inject 0.3 mg into the muscle as needed for anaphylaxis. 1 each 1   calcium-vitamin D (OSCAL WITH D) 500-200 MG-UNIT tablet Take 1 tablet by mouth.     Cetirizine HCl 10 MG CAPS Take 1 capsule by mouth daily.     famotidine (PEPCID) 10 MG tablet Take 10 mg by mouth 2 (two) times daily.     Olopatadine-Mometasone (RYALTRIS) MCG/ACT SUSP Place 1-2 sprays into the nose in the morning and at bedtime. 29 g 5  omeprazole (PRILOSEC) 20 MG capsule Take 20 mg by mouth daily.     No current facility-administered medications for this visit.   Allergies: No Known Allergies I reviewed her past medical history, social history, family history, and environmental history and no significant changes have been reported from her previous visit.  Review of Systems  Constitutional:   Negative for appetite change, chills, fever and unexpected weight change.  HENT:  Positive for sinus pressure. Negative for congestion, postnasal drip and sneezing.        Ear fullness  Eyes:  Negative for itching.  Respiratory:  Negative for cough, chest tightness, shortness of breath and wheezing.   Cardiovascular:  Negative for chest pain.  Gastrointestinal:  Negative for abdominal pain.  Genitourinary:  Negative for difficulty urinating.  Skin:  Negative for rash.  Allergic/Immunologic: Positive for environmental allergies.   Objective: BP 128/78 (BP Location: Left Arm, Patient Position: Sitting, Cuff Size: Normal)    Pulse 82    Temp 98.3 F (36.8 C) (Temporal)    Resp 18    SpO2 98%  There is no height or weight on file to calculate BMI. Physical Exam Vitals and nursing note reviewed.  Constitutional:      Appearance: Normal appearance. She is well-developed.  HENT:     Head: Normocephalic and atraumatic.     Right Ear: Tympanic membrane and external ear normal.     Left Ear: Tympanic membrane and external ear normal.     Nose: Nose normal.     Mouth/Throat:     Mouth: Mucous membranes are moist.     Pharynx: Oropharynx is clear.  Eyes:     Conjunctiva/sclera: Conjunctivae normal.  Cardiovascular:     Rate and Rhythm: Normal rate and regular rhythm.     Heart sounds: Normal heart sounds. No murmur heard.   No friction rub. No gallop.  Pulmonary:     Effort: Pulmonary effort is normal.     Breath sounds: Normal breath sounds. No wheezing, rhonchi or rales.  Musculoskeletal:     Cervical back: Neck supple.  Skin:    General: Skin is warm.     Findings: No rash.  Neurological:     Mental Status: She is alert and oriented to person, place, and time.  Psychiatric:        Mood and Affect: Mood normal.        Behavior: Behavior normal.  Previous notes and tests were reviewed. The plan was reviewed with the patient/family, and all questions/concerned were addressed.  It  was my pleasure to see Elizabeth Briggs today and participate in her care. Please feel free to contact me with any questions or concerns.  Sincerely,  Wyline Mood, DO Allergy & Immunology  [facility] office: [phone] [facility] office: [phone]

## 2021-03-12 ENCOUNTER — Ambulatory Visit (INDEPENDENT_AMBULATORY_CARE_PROVIDER_SITE_OTHER): Payer: BC Managed Care – PPO | Admitting: Allergy

## 2021-03-12 ENCOUNTER — Encounter: Payer: Self-pay | Admitting: Allergy

## 2021-03-12 ENCOUNTER — Ambulatory Visit: Payer: BC Managed Care – PPO

## 2021-03-12 ENCOUNTER — Other Ambulatory Visit: Payer: Self-pay

## 2021-03-12 VITALS — BP 128/78 | HR 82 | Temp 98.3°F | Resp 18

## 2021-03-12 DIAGNOSIS — K219 Gastro-esophageal reflux disease without esophagitis: Secondary | ICD-10-CM | POA: Diagnosis not present

## 2021-03-12 DIAGNOSIS — R12 Heartburn: Secondary | ICD-10-CM

## 2021-03-12 DIAGNOSIS — J309 Allergic rhinitis, unspecified: Secondary | ICD-10-CM | POA: Diagnosis not present

## 2021-03-12 DIAGNOSIS — H6983 Other specified disorders of Eustachian tube, bilateral: Secondary | ICD-10-CM | POA: Diagnosis not present

## 2021-03-12 DIAGNOSIS — H6993 Unspecified Eustachian tube disorder, bilateral: Secondary | ICD-10-CM

## 2021-03-12 DIAGNOSIS — J302 Other seasonal allergic rhinitis: Secondary | ICD-10-CM

## 2021-03-12 MED ORDER — RYALTRIS 665-25 MCG/ACT NA SUSP: 1.0000 | Freq: Two times a day (BID) | NASAL | 5 refills | Status: DC

## 2021-03-12 NOTE — Assessment & Plan Note (Signed)
Past history - Rhinitis symptoms with ear pain and pressure for the past 9 months. Seen by ENT who referred her to allergy for testing. Tried Flonase, Azelastine, zyrtec and oral decongestants with some benefit. 2021 skin testing showed: Positive to grass, ragweed, weed, trees, dust mites, cat. started AIT on 08/02/2019 (G-W-RW-T & DM-C-CR) Interim history - large localized reaction only lasting 1-2 days, denies any other associated symptoms. Ear fullness still present.  Continue environmental control measures.   Continue allergy injections (decreased to red 0.75mL) - given today.   Double up on the antihistamines the day before, the day of and the day after injection.  You can take Zyrtec, Claritin, Allegra or Xyzal.   Take Singulair 10mg  the day before the injection.  May use topical hydrocortisone cream as needed on the rash.   Start Ryaltris (olopatadine + mometasone nasal spray combination) 1-2 sprays per nostril twice a day. Sample given.  This replaces Flonase.  Let know if too expensive.  Nasal saline spray (i.e., Simply Saline) or nasal saline lavage (i.e., NeilMed) is recommended as needed and prior to medicated nasal sprays.

## 2021-03-12 NOTE — Assessment & Plan Note (Signed)
·   Continue lifestyle and dietary modifications.  May use famotidine 20mg  1-2 times a day as needed.  May use Prilosec 20mg  daily a day as needed.

## 2021-03-12 NOTE — Patient Instructions (Addendum)
Environmental allergies 2021 skin testing Positive to grass, ragweed, weed, trees, dust mites, cat. Continue environmental control measures.  Continue allergy injections - given today.  Double up on the antihistamines the day before, the day of and the day after injection. You can take Zyrtec, Claritin, Allegra or Xyzal.  Take Singulair 10mg  the day before the injection. May use topical hydrocortisone cream as needed on the rash.   Start Ryaltris (olopatadine + mometasone nasal spray combination) 1-2 sprays per nostril twice a day. Sample given. This replaces Flonase. Let know if too expensive.  Nasal saline spray (i.e., Simply Saline) or nasal saline lavage (i.e., NeilMed) is recommended as needed and prior to medicated nasal sprays.  Ears: Recommend ENT evaluation again.  Reflux: Continue lifestyle and dietary modifications. May use famotidine 20mg  1-2 times a day as needed. May use Prilosec 20mg  daily a day as needed.   Follow up in 6 months or sooner if needed.   Reducing Pollen Exposure Pollen seasons: trees (spring), grass (summer) and ragweed/weeds (fall). Keep windows closed in your home and car to lower pollen exposure.  Install air conditioning in the bedroom and throughout the house if possible.  Avoid going out in dry windy days - especially early morning. Pollen counts are highest between 5 - 10 AM and on dry, hot and windy days.  Save outside activities for late afternoon or after a heavy rain, when pollen levels are lower.  Avoid mowing of grass if you have grass pollen allergy. Be aware that pollen can also be transported indoors on people and pets.  Dry your clothes in an automatic dryer rather than hanging them outside where they might collect pollen.  Rinse hair and eyes before bedtime. Control of House Dust Mite Allergen Dust mite allergens are a common trigger of allergy and asthma symptoms. While they can be found throughout the house, these microscopic  creatures thrive in warm, humid environments such as bedding, upholstered furniture and carpeting. Because so much time is spent in the bedroom, it is essential to reduce mite levels there.  Encase pillows, mattresses, and box springs in special allergen-proof fabric covers or airtight, zippered plastic covers.  Bedding should be washed weekly in hot water (130 F) and dried in a hot dryer. Allergen-proof covers are available for comforters and pillows that cant be regularly washed.  Wash the allergy-proof covers every few months. Minimize clutter in the bedroom. Keep pets out of the bedroom.  Keep humidity less than 50% by using a dehumidifier or air conditioning. You can buy a humidity measuring device called a hygrometer to monitor this.  If possible, replace carpets with hardwood, linoleum, or washable area rugs. If that's not possible, vacuum frequently with a vacuum that has a HEPA filter. Remove all upholstered furniture and non-washable window drapes from the bedroom. Remove all non-washable stuffed toys from the bedroom.  Wash stuffed toys weekly. Pet Allergen Avoidance: Contrary to popular opinion, there are no hypoallergenic breeds of dogs or cats. That is because people are not allergic to an animals hair, but to an allergen found in the animal's saliva, dander (dead skin flakes) or urine. Pet allergy symptoms typically occur within minutes. For some people, symptoms can build up and become most severe 8 to 12 hours after contact with the animal. People with severe allergies can experience reactions in public places if dander has been transported on the pet owners clothing. Keeping an animal outdoors is only a partial solution, since homes with pets in  the yard still have higher concentrations of animal allergens. Before getting a pet, ask your allergist to determine if you are allergic to animals. If your pet is already considered part of your family, try to minimize contact and keep  the pet out of the bedroom and other rooms where you spend a great deal of time. As with dust mites, vacuum carpets often or replace carpet with a hardwood floor, tile or linoleum. High-efficiency particulate air (HEPA) cleaners can reduce allergen levels over time. While dander and saliva are the source of cat and dog allergens, urine is the source of allergens from rabbits, hamsters, mice and [place] pigs; so ask a non-allergic family member to clean the animals cage. If you have a pet allergy, talk to your allergist about the potential for allergy immunotherapy (allergy shots). This strategy can often provide long-term relief.

## 2021-03-12 NOTE — Assessment & Plan Note (Signed)
Unchanged.  Recommend ENT evaluation again.

## 2021-03-19 ENCOUNTER — Other Ambulatory Visit: Payer: Self-pay

## 2021-03-19 ENCOUNTER — Ambulatory Visit (INDEPENDENT_AMBULATORY_CARE_PROVIDER_SITE_OTHER): Payer: BC Managed Care – PPO

## 2021-03-19 DIAGNOSIS — J309 Allergic rhinitis, unspecified: Secondary | ICD-10-CM | POA: Diagnosis not present

## 2021-03-26 ENCOUNTER — Ambulatory Visit (INDEPENDENT_AMBULATORY_CARE_PROVIDER_SITE_OTHER): Payer: BC Managed Care – PPO

## 2021-03-26 ENCOUNTER — Other Ambulatory Visit: Payer: Self-pay

## 2021-03-26 DIAGNOSIS — J309 Allergic rhinitis, unspecified: Secondary | ICD-10-CM | POA: Diagnosis not present

## 2021-04-04 ENCOUNTER — Ambulatory Visit (INDEPENDENT_AMBULATORY_CARE_PROVIDER_SITE_OTHER): Payer: BC Managed Care – PPO

## 2021-04-04 DIAGNOSIS — J309 Allergic rhinitis, unspecified: Secondary | ICD-10-CM | POA: Diagnosis not present

## 2021-04-09 ENCOUNTER — Other Ambulatory Visit: Payer: Self-pay

## 2021-04-09 ENCOUNTER — Ambulatory Visit (INDEPENDENT_AMBULATORY_CARE_PROVIDER_SITE_OTHER): Payer: BC Managed Care – PPO

## 2021-04-09 DIAGNOSIS — J309 Allergic rhinitis, unspecified: Secondary | ICD-10-CM | POA: Diagnosis not present

## 2021-04-16 ENCOUNTER — Ambulatory Visit (INDEPENDENT_AMBULATORY_CARE_PROVIDER_SITE_OTHER): Payer: BC Managed Care – PPO | Admitting: *Deleted

## 2021-04-16 ENCOUNTER — Other Ambulatory Visit: Payer: Self-pay

## 2021-04-16 DIAGNOSIS — J309 Allergic rhinitis, unspecified: Secondary | ICD-10-CM | POA: Diagnosis not present

## 2021-04-22 DIAGNOSIS — J3089 Other allergic rhinitis: Secondary | ICD-10-CM | POA: Diagnosis not present

## 2021-04-22 NOTE — Progress Notes (Signed)
VIALS EXP 04-23-22 ?

## 2021-04-23 ENCOUNTER — Other Ambulatory Visit: Payer: Self-pay

## 2021-04-23 ENCOUNTER — Ambulatory Visit (INDEPENDENT_AMBULATORY_CARE_PROVIDER_SITE_OTHER): Payer: BC Managed Care – PPO

## 2021-04-23 DIAGNOSIS — J309 Allergic rhinitis, unspecified: Secondary | ICD-10-CM

## 2021-04-30 ENCOUNTER — Ambulatory Visit (INDEPENDENT_AMBULATORY_CARE_PROVIDER_SITE_OTHER): Payer: BC Managed Care – PPO

## 2021-04-30 DIAGNOSIS — J309 Allergic rhinitis, unspecified: Secondary | ICD-10-CM

## 2021-05-09 ENCOUNTER — Ambulatory Visit (INDEPENDENT_AMBULATORY_CARE_PROVIDER_SITE_OTHER): Payer: BC Managed Care – PPO

## 2021-05-09 DIAGNOSIS — J309 Allergic rhinitis, unspecified: Secondary | ICD-10-CM

## 2021-05-14 ENCOUNTER — Ambulatory Visit (INDEPENDENT_AMBULATORY_CARE_PROVIDER_SITE_OTHER): Payer: BC Managed Care – PPO

## 2021-05-14 DIAGNOSIS — J309 Allergic rhinitis, unspecified: Secondary | ICD-10-CM

## 2021-05-23 ENCOUNTER — Ambulatory Visit (INDEPENDENT_AMBULATORY_CARE_PROVIDER_SITE_OTHER): Payer: BC Managed Care – PPO

## 2021-05-23 DIAGNOSIS — J309 Allergic rhinitis, unspecified: Secondary | ICD-10-CM

## 2021-05-30 ENCOUNTER — Ambulatory Visit (INDEPENDENT_AMBULATORY_CARE_PROVIDER_SITE_OTHER): Payer: BC Managed Care – PPO

## 2021-05-30 DIAGNOSIS — J309 Allergic rhinitis, unspecified: Secondary | ICD-10-CM | POA: Diagnosis not present

## 2021-06-04 ENCOUNTER — Ambulatory Visit (INDEPENDENT_AMBULATORY_CARE_PROVIDER_SITE_OTHER): Payer: BC Managed Care – PPO | Admitting: *Deleted

## 2021-06-04 DIAGNOSIS — J309 Allergic rhinitis, unspecified: Secondary | ICD-10-CM | POA: Diagnosis not present

## 2021-06-11 ENCOUNTER — Ambulatory Visit (INDEPENDENT_AMBULATORY_CARE_PROVIDER_SITE_OTHER): Payer: BC Managed Care – PPO | Admitting: *Deleted

## 2021-06-11 DIAGNOSIS — J309 Allergic rhinitis, unspecified: Secondary | ICD-10-CM | POA: Diagnosis not present

## 2021-06-25 ENCOUNTER — Ambulatory Visit (INDEPENDENT_AMBULATORY_CARE_PROVIDER_SITE_OTHER): Payer: BC Managed Care – PPO

## 2021-06-25 DIAGNOSIS — J309 Allergic rhinitis, unspecified: Secondary | ICD-10-CM

## 2021-07-09 ENCOUNTER — Ambulatory Visit (INDEPENDENT_AMBULATORY_CARE_PROVIDER_SITE_OTHER): Payer: BC Managed Care – PPO

## 2021-07-09 DIAGNOSIS — J309 Allergic rhinitis, unspecified: Secondary | ICD-10-CM

## 2021-07-23 ENCOUNTER — Ambulatory Visit (INDEPENDENT_AMBULATORY_CARE_PROVIDER_SITE_OTHER): Payer: BC Managed Care – PPO

## 2021-07-23 DIAGNOSIS — J309 Allergic rhinitis, unspecified: Secondary | ICD-10-CM | POA: Diagnosis not present

## 2021-08-06 ENCOUNTER — Ambulatory Visit (INDEPENDENT_AMBULATORY_CARE_PROVIDER_SITE_OTHER): Payer: BC Managed Care – PPO

## 2021-08-06 DIAGNOSIS — J309 Allergic rhinitis, unspecified: Secondary | ICD-10-CM | POA: Diagnosis not present

## 2021-08-19 DIAGNOSIS — J3089 Other allergic rhinitis: Secondary | ICD-10-CM | POA: Diagnosis not present

## 2021-08-19 NOTE — Progress Notes (Signed)
VIALS EXP 08-20-22 

## 2021-08-20 ENCOUNTER — Ambulatory Visit (INDEPENDENT_AMBULATORY_CARE_PROVIDER_SITE_OTHER): Payer: BC Managed Care – PPO

## 2021-08-20 DIAGNOSIS — J309 Allergic rhinitis, unspecified: Secondary | ICD-10-CM

## 2021-09-03 ENCOUNTER — Ambulatory Visit (INDEPENDENT_AMBULATORY_CARE_PROVIDER_SITE_OTHER): Payer: BC Managed Care – PPO

## 2021-09-03 DIAGNOSIS — J309 Allergic rhinitis, unspecified: Secondary | ICD-10-CM

## 2021-09-09 NOTE — Progress Notes (Unsigned)
Follow Up Note  RE: Elizabeth Briggs MRN: [id number] DOB: 1985-02-04 Date of Office Visit: 09/10/2021  Referring provider: Nolene Ebbs Primary care provider: Nolene Ebbs  Chief Complaint: No chief complaint on file.  History of Present Illness: I had the pleasure of seeing Elizabeth Briggs for a follow up visit at the [facility] on 09/09/2021. She is a 36 y.o. female, who is being followed for allergic rhinitis on AIT, eustachian tube dysfunction and heartburn. Her previous allergy office visit was on 03/12/2021 with Dr. Selena Batten. Today is a regular follow up visit.  Seasonal and perennial allergic rhinitis Past history - Rhinitis symptoms with ear pain and pressure for the past 9 months. Seen by ENT who referred her to allergy for testing. Tried Flonase, Azelastine, zyrtec and oral decongestants with some benefit. 2021 skin testing showed: Positive to grass, ragweed, weed, trees, dust mites, cat. started AIT on 08/02/2019 (G-W-RW-T & DM-C-CR) Interim history - large localized reaction only lasting 1-2 days, denies any other associated symptoms. Ear fullness still present. Continue environmental control measures.  Continue allergy injections (decreased to red 0.79mL) - given today.  Double up on the antihistamines the day before, the day of and the day after injection. You can take Zyrtec, Claritin, Allegra or Xyzal.  Take Singulair 10mg  the day before the injection. May use topical hydrocortisone cream as needed on the rash.  Start Ryaltris (olopatadine + mometasone nasal spray combination) 1-2 sprays per nostril twice a day. Sample given. This replaces Flonase. Let know if too expensive. Nasal saline spray (i.e., Simply Saline) or nasal saline lavage (i.e., NeilMed) is recommended as needed and prior to medicated nasal sprays.   Dysfunction of both eustachian tubes Unchanged. Recommend ENT evaluation again.   Heartburn Continue lifestyle and dietary  modifications. May use famotidine 20mg  1-2 times a day as needed. May use Prilosec 20mg  daily a day as needed.    Return in about 6 months (around 09/09/2021).  Assessment and Plan: Elizabeth Briggs is a 36 y.o. female with: No problem-specific Assessment & Plan notes found for this encounter.  No follow-ups on file.  No orders of the defined types were placed in this encounter.  Lab Orders  No laboratory test(s) ordered today    Diagnostics: Spirometry:  Tracings reviewed. Her effort: {Blank single:19197::"Good reproducible efforts.","It was hard to get consistent efforts and there is a question as to whether this reflects a maximal maneuver.","Poor effort, data can not be interpreted."} FVC: ***L FEV1: ***L, ***% predicted FEV1/FVC ratio: ***% Interpretation: {Blank single:19197::"Spirometry consistent with mild obstructive disease","Spirometry consistent with moderate obstructive disease","Spirometry consistent with severe obstructive disease","Spirometry consistent with possible restrictive disease","Spirometry consistent with mixed obstructive and restrictive disease","Spirometry uninterpretable due to technique","Spirometry consistent with normal pattern","No overt abnormalities noted given today's efforts"}.  Please see scanned spirometry results for details.  Skin Testing: {Blank single:19197::"Select foods","Environmental allergy panel","Environmental allergy panel and select foods","Food allergy panel","None","Deferred due to recent antihistamines use"}. *** Results discussed with patient/family.   Medication List:  Current Outpatient Medications  Medication Sig Dispense Refill  . AUVI-Q 0.3 MG/0.3ML SOAJ injection Inject 0.3 mg into the muscle as needed for anaphylaxis. 1 each 1  . calcium-vitamin D (OSCAL WITH D) 500-200 MG-UNIT tablet Take 1 tablet by mouth.    . Cetirizine HCl 10 MG CAPS Take 1 capsule by mouth daily.    . famotidine (PEPCID) 10 MG tablet Take 10 mg by mouth 2  (two) times daily.    09/11/2021  Olopatadine-Mometasone (RYALTRIS) X543819 MCG/ACT SUSP Place 1-2 sprays into the nose in the morning and at bedtime. 29 g 5  . omeprazole (PRILOSEC) 20 MG capsule Take 20 mg by mouth daily.     No current facility-administered medications for this visit.   Allergies: No Known Allergies I reviewed her past medical history, social history, family history, and environmental history and no significant changes have been reported from her previous visit.  Review of Systems  Constitutional:  Negative for appetite change, chills, fever and unexpected weight change.  HENT:  Positive for sinus pressure. Negative for congestion, postnasal drip and sneezing.        Ear fullness  Eyes:  Negative for itching.  Respiratory:  Negative for cough, chest tightness, shortness of breath and wheezing.   Cardiovascular:  Negative for chest pain.  Gastrointestinal:  Negative for abdominal pain.  Genitourinary:  Negative for difficulty urinating.  Skin:  Negative for rash.  Allergic/Immunologic: Positive for environmental allergies.   Objective: There were no vitals taken for this visit. There is no height or weight on file to calculate BMI. Physical Exam Vitals and nursing note reviewed.  Constitutional:      Appearance: Normal appearance. She is well-developed.  HENT:     Head: Normocephalic and atraumatic.     Right Ear: Tympanic membrane and external ear normal.     Left Ear: Tympanic membrane and external ear normal.     Nose: Nose normal.     Mouth/Throat:     Mouth: Mucous membranes are moist.     Pharynx: Oropharynx is clear.  Eyes:     Conjunctiva/sclera: Conjunctivae normal.  Cardiovascular:     Rate and Rhythm: Normal rate and regular rhythm.     Heart sounds: Normal heart sounds. No murmur heard.    No friction rub. No gallop.  Pulmonary:     Effort: Pulmonary effort is normal.     Breath sounds: Normal breath sounds. No wheezing, rhonchi or rales.   Musculoskeletal:     Cervical back: Neck supple.  Skin:    General: Skin is warm.     Findings: No rash.  Neurological:     Mental Status: She is alert and oriented to person, place, and time.  Psychiatric:        Mood and Affect: Mood normal.        Behavior: Behavior normal.  Previous notes and tests were reviewed. The plan was reviewed with the patient/family, and all questions/concerned were addressed.  It was my pleasure to see Elizabeth Briggs today and participate in her care. Please feel free to contact me with any questions or concerns.  Sincerely,  Wyline Mood, DO Allergy & Immunology  [facility] office: [phone] [facility] office: [phone]

## 2021-09-10 ENCOUNTER — Encounter: Payer: Self-pay | Admitting: Allergy

## 2021-09-10 ENCOUNTER — Ambulatory Visit (INDEPENDENT_AMBULATORY_CARE_PROVIDER_SITE_OTHER): Payer: BC Managed Care – PPO | Admitting: Allergy

## 2021-09-10 VITALS — BP 104/80 | HR 59 | Temp 98.2°F | Resp 18 | Ht 62.0 in | Wt 126.8 lb

## 2021-09-10 DIAGNOSIS — J3089 Other allergic rhinitis: Secondary | ICD-10-CM | POA: Diagnosis not present

## 2021-09-10 DIAGNOSIS — H6983 Other specified disorders of Eustachian tube, bilateral: Secondary | ICD-10-CM

## 2021-09-10 DIAGNOSIS — H6993 Unspecified Eustachian tube disorder, bilateral: Secondary | ICD-10-CM

## 2021-09-10 DIAGNOSIS — K219 Gastro-esophageal reflux disease without esophagitis: Secondary | ICD-10-CM | POA: Diagnosis not present

## 2021-09-10 DIAGNOSIS — J302 Other seasonal allergic rhinitis: Secondary | ICD-10-CM

## 2021-09-10 MED ORDER — EPINEPHRINE 0.3 MG/0.3ML IJ SOAJ: 0.3000 mg | INTRAMUSCULAR | 1 refills | Status: DC | PRN

## 2021-09-10 NOTE — Assessment & Plan Note (Signed)
Past history - ENT apparently "poked" a hole in the past with no benefit. Interim history - unchanged.  Recommend ENT evaluation again.

## 2021-09-10 NOTE — Assessment & Plan Note (Signed)
Past history - Rhinitis symptoms with ear pain and pressure for the past 9 months. Seen by ENT who referred her to allergy for testing. Tried Flonase, Azelastine, zyrtec and oral decongestants with some benefit. 2021 skin testing showed: Positive to grass, ragweed, weed, trees, dust mites, cat. started AIT on 08/02/2019 (G-W-RW-T & DM-C-CR) Interim history - doing well with AIT. Ryaltris not more effective than Flonase.   Continue environmental control measures.   Continue allergy injections - will go every 3 weeks with the new red vial.   Double up on the antihistamines the day of injections.   You can take Zyrtec, Claritin, Allegra or Xyzal.   Take Singulair 10mg  the day before the injection.  May use topical hydrocortisone cream as needed on the rash.   Use Flonase (fluticasone) nasal spray 2 spray per nostril once a day as needed for nasal congestion.   Nasal saline spray (i.e., Simply Saline) or nasal saline lavage (i.e., NeilMed) is recommended as needed and prior to medicated nasal sprays.

## 2021-09-10 NOTE — Patient Instructions (Addendum)
Environmental allergies 2021 skin testing Positive to grass, ragweed, weed, trees, dust mites, cat. Continue environmental control measures.  Continue allergy injections - will go every 3 weeks with the new red vial.  Double up on the antihistamines the day of injections.  You can take Zyrtec, Claritin, Allegra or Xyzal.  Take Singulair 10mg  the day before the injection. May use topical hydrocortisone cream as needed on the rash.   Use Flonase (fluticasone) nasal spray 2 spray per nostril once a day as needed for nasal congestion.  Nasal saline spray (i.e., Simply Saline) or nasal saline lavage (i.e., NeilMed) is recommended as needed and prior to medicated nasal sprays.  Ears: Recommend ENT evaluation again.  Reflux: Continue lifestyle and dietary modifications. May use famotidine 20mg  1-2 times a day as needed. May use Prilosec 20mg  daily a day as needed.   Follow up in 12 months or sooner if needed.

## 2021-09-10 NOTE — Assessment & Plan Note (Signed)
Controlled with no recent flares.   Continue lifestyle and dietary modifications.  May use famotidine 20mg  1-2 times a day as needed.  May use Prilosec 20mg  daily a day as needed.

## 2021-09-11 ENCOUNTER — Encounter: Payer: Self-pay | Admitting: Allergy

## 2021-09-11 ENCOUNTER — Other Ambulatory Visit: Payer: Self-pay

## 2021-09-11 MED ORDER — EPINEPHRINE 0.3 MG/0.3ML IJ SOAJ: 0.3000 mg | Freq: Once | INTRAMUSCULAR | 1 refills | Status: AC

## 2021-09-19 ENCOUNTER — Ambulatory Visit (INDEPENDENT_AMBULATORY_CARE_PROVIDER_SITE_OTHER): Payer: BC Managed Care – PPO

## 2021-09-19 DIAGNOSIS — J309 Allergic rhinitis, unspecified: Secondary | ICD-10-CM | POA: Diagnosis not present

## 2021-09-24 ENCOUNTER — Ambulatory Visit (INDEPENDENT_AMBULATORY_CARE_PROVIDER_SITE_OTHER): Payer: BC Managed Care – PPO

## 2021-09-24 DIAGNOSIS — J309 Allergic rhinitis, unspecified: Secondary | ICD-10-CM

## 2021-10-01 ENCOUNTER — Ambulatory Visit (INDEPENDENT_AMBULATORY_CARE_PROVIDER_SITE_OTHER): Payer: BC Managed Care – PPO

## 2021-10-01 DIAGNOSIS — J309 Allergic rhinitis, unspecified: Secondary | ICD-10-CM

## 2021-10-08 ENCOUNTER — Ambulatory Visit (INDEPENDENT_AMBULATORY_CARE_PROVIDER_SITE_OTHER): Payer: BC Managed Care – PPO

## 2021-10-08 DIAGNOSIS — J309 Allergic rhinitis, unspecified: Secondary | ICD-10-CM

## 2021-10-15 ENCOUNTER — Ambulatory Visit (INDEPENDENT_AMBULATORY_CARE_PROVIDER_SITE_OTHER): Payer: BC Managed Care – PPO

## 2021-10-15 DIAGNOSIS — J309 Allergic rhinitis, unspecified: Secondary | ICD-10-CM

## 2021-11-05 ENCOUNTER — Ambulatory Visit (INDEPENDENT_AMBULATORY_CARE_PROVIDER_SITE_OTHER): Payer: BC Managed Care – PPO

## 2021-11-05 DIAGNOSIS — J309 Allergic rhinitis, unspecified: Secondary | ICD-10-CM

## 2021-11-26 ENCOUNTER — Ambulatory Visit (INDEPENDENT_AMBULATORY_CARE_PROVIDER_SITE_OTHER): Payer: BC Managed Care – PPO

## 2021-11-26 DIAGNOSIS — J309 Allergic rhinitis, unspecified: Secondary | ICD-10-CM | POA: Diagnosis not present

## 2021-12-17 ENCOUNTER — Ambulatory Visit (INDEPENDENT_AMBULATORY_CARE_PROVIDER_SITE_OTHER): Payer: BC Managed Care – PPO

## 2021-12-17 DIAGNOSIS — J309 Allergic rhinitis, unspecified: Secondary | ICD-10-CM | POA: Diagnosis not present

## 2022-01-09 ENCOUNTER — Ambulatory Visit (INDEPENDENT_AMBULATORY_CARE_PROVIDER_SITE_OTHER): Payer: BC Managed Care – PPO

## 2022-01-09 DIAGNOSIS — J309 Allergic rhinitis, unspecified: Secondary | ICD-10-CM | POA: Diagnosis not present

## 2022-01-14 ENCOUNTER — Ambulatory Visit (INDEPENDENT_AMBULATORY_CARE_PROVIDER_SITE_OTHER): Payer: BC Managed Care – PPO

## 2022-01-14 DIAGNOSIS — J309 Allergic rhinitis, unspecified: Secondary | ICD-10-CM | POA: Diagnosis not present

## 2022-01-23 ENCOUNTER — Ambulatory Visit (INDEPENDENT_AMBULATORY_CARE_PROVIDER_SITE_OTHER): Payer: BC Managed Care – PPO

## 2022-01-23 DIAGNOSIS — J309 Allergic rhinitis, unspecified: Secondary | ICD-10-CM | POA: Diagnosis not present

## 2022-01-28 ENCOUNTER — Ambulatory Visit (INDEPENDENT_AMBULATORY_CARE_PROVIDER_SITE_OTHER): Payer: BC Managed Care – PPO | Admitting: *Deleted

## 2022-01-28 DIAGNOSIS — J309 Allergic rhinitis, unspecified: Secondary | ICD-10-CM | POA: Diagnosis not present

## 2022-01-28 NOTE — Progress Notes (Signed)
VIALS EXP 01-29-23 

## 2022-01-29 DIAGNOSIS — J3089 Other allergic rhinitis: Secondary | ICD-10-CM | POA: Diagnosis not present

## 2022-02-18 ENCOUNTER — Ambulatory Visit (INDEPENDENT_AMBULATORY_CARE_PROVIDER_SITE_OTHER): Payer: BC Managed Care – PPO

## 2022-02-18 DIAGNOSIS — J309 Allergic rhinitis, unspecified: Secondary | ICD-10-CM

## 2022-02-25 ENCOUNTER — Ambulatory Visit (INDEPENDENT_AMBULATORY_CARE_PROVIDER_SITE_OTHER): Payer: BC Managed Care – PPO

## 2022-02-25 DIAGNOSIS — J309 Allergic rhinitis, unspecified: Secondary | ICD-10-CM | POA: Diagnosis not present

## 2022-03-04 ENCOUNTER — Ambulatory Visit (INDEPENDENT_AMBULATORY_CARE_PROVIDER_SITE_OTHER): Payer: BC Managed Care – PPO

## 2022-03-04 DIAGNOSIS — J309 Allergic rhinitis, unspecified: Secondary | ICD-10-CM | POA: Diagnosis not present

## 2022-03-11 ENCOUNTER — Ambulatory Visit (INDEPENDENT_AMBULATORY_CARE_PROVIDER_SITE_OTHER): Payer: BC Managed Care – PPO

## 2022-03-11 DIAGNOSIS — J309 Allergic rhinitis, unspecified: Secondary | ICD-10-CM | POA: Diagnosis not present

## 2022-03-18 ENCOUNTER — Ambulatory Visit (INDEPENDENT_AMBULATORY_CARE_PROVIDER_SITE_OTHER): Payer: BC Managed Care – PPO

## 2022-03-18 DIAGNOSIS — J309 Allergic rhinitis, unspecified: Secondary | ICD-10-CM | POA: Diagnosis not present

## 2022-04-08 ENCOUNTER — Ambulatory Visit (INDEPENDENT_AMBULATORY_CARE_PROVIDER_SITE_OTHER): Payer: BC Managed Care – PPO

## 2022-04-08 DIAGNOSIS — J309 Allergic rhinitis, unspecified: Secondary | ICD-10-CM | POA: Diagnosis not present

## 2022-04-29 ENCOUNTER — Ambulatory Visit (INDEPENDENT_AMBULATORY_CARE_PROVIDER_SITE_OTHER): Payer: BC Managed Care – PPO

## 2022-04-29 DIAGNOSIS — J309 Allergic rhinitis, unspecified: Secondary | ICD-10-CM

## 2022-05-06 ENCOUNTER — Ambulatory Visit (INDEPENDENT_AMBULATORY_CARE_PROVIDER_SITE_OTHER): Payer: BC Managed Care – PPO

## 2022-05-06 DIAGNOSIS — J309 Allergic rhinitis, unspecified: Secondary | ICD-10-CM

## 2022-05-13 ENCOUNTER — Ambulatory Visit (INDEPENDENT_AMBULATORY_CARE_PROVIDER_SITE_OTHER): Payer: BC Managed Care – PPO

## 2022-05-13 DIAGNOSIS — J309 Allergic rhinitis, unspecified: Secondary | ICD-10-CM | POA: Diagnosis not present

## 2022-05-20 ENCOUNTER — Ambulatory Visit (INDEPENDENT_AMBULATORY_CARE_PROVIDER_SITE_OTHER): Payer: BC Managed Care – PPO

## 2022-05-20 DIAGNOSIS — J309 Allergic rhinitis, unspecified: Secondary | ICD-10-CM

## 2022-05-27 DIAGNOSIS — J3089 Other allergic rhinitis: Secondary | ICD-10-CM | POA: Diagnosis not present

## 2022-05-27 NOTE — Progress Notes (Signed)
VIALS EXP 05-27-23 

## 2022-06-05 ENCOUNTER — Ambulatory Visit: Payer: BC Managed Care – PPO | Admitting: Family Medicine

## 2022-06-05 ENCOUNTER — Encounter: Payer: Self-pay | Admitting: Family Medicine

## 2022-06-05 VITALS — BP 127/83 | HR 97 | Temp 98.0°F | Ht 62.0 in | Wt 118.0 lb

## 2022-06-05 DIAGNOSIS — J358 Other chronic diseases of tonsils and adenoids: Secondary | ICD-10-CM | POA: Diagnosis not present

## 2022-06-05 DIAGNOSIS — H6993 Unspecified Eustachian tube disorder, bilateral: Secondary | ICD-10-CM

## 2022-06-05 DIAGNOSIS — H938X3 Other specified disorders of ear, bilateral: Secondary | ICD-10-CM | POA: Diagnosis not present

## 2022-06-05 MED ORDER — PREDNISONE 10 MG (21) PO TBPK
ORAL_TABLET | ORAL | 0 refills | Status: DC
Start: 2022-06-05 — End: 2022-10-09

## 2022-06-05 NOTE — Progress Notes (Signed)
Subjective:  Patient ID: Elizabeth Briggs, female    DOB: [birth year], 37 y.o.   MRN: [id number]  Patient Care Team: Sonny Masters, FNP as PCP - General (Family Medicine)   Chief Complaint:  New Patient (Initial Visit) [facility] star ), Establish Care, and Allergies (Worse x 1 month )   HPI: Elizabeth Briggs is a 37 y.o. female presenting on 06/05/2022 for New Patient (Initial Visit) [facility] star ), Establish Care, and Allergies (Worse x 1 month )   Pt presents today to establish care with new PCP and for evaluation of ongoing allergies, bilateral ear pressure, and a tonsil cyst. She is followed by allergist and is on maintenance allergy shots. She was seen by ENT in 2021 and told she did not have Eustachian tube dysfunction and was told to go to allergist. States the Flonase has helped minimally with the ear pressure. She continues to have ear pressure. She also has a cyst on her left tonsil. States the dentist will pop this at times but it continues to come back.     Relevant past medical, surgical, family, and social history reviewed and updated as indicated.  Allergies and medications reviewed and updated. Data reviewed: Chart in Epic.   Past Medical History:  Diagnosis Date   Abnormal Pap smear of cervix 2004   Allergies    Eczema     Past Surgical History:  Procedure Laterality Date   COLPOSCOPY     MOUTH SURGERY     TYMPANOSTOMY TUBE PLACEMENT      Social History   Socioeconomic History   Marital status: Married    Spouse name: Not on file   Number of children: 1   Years of education: Not on file   Highest education level: Not on file  Occupational History   Not on file  Tobacco Use   Smoking status: Former    Types: Cigarettes    Start date: 2003    Quit date: 2016    Years since quitting: 8.3   Smokeless tobacco: Never  Vaping Use   Vaping Use: Never used  Substance and Sexual Activity   Alcohol use: No   Drug use: No   Sexual activity: Yes    Partners:  Male    Birth control/protection: None  Other Topics Concern   Not on file  Social History Narrative   Not on file   Social Determinants of Health   Financial Resource Strain: Not on file  Food Insecurity: Not on file  Transportation Needs: Not on file  Physical Activity: Not on file  Stress: Not on file  Social Connections: Not on file  Intimate Partner Violence: Not on file    Outpatient Encounter Medications as of 06/05/2022  Medication Sig   Cetirizine HCl 10 MG CAPS Take 1 capsule by mouth daily.   EPINEPHrine (AUVI-Q) 0.3 mg/0.3 mL IJ SOAJ injection Inject 0.3 mg into the muscle as needed for anaphylaxis.   Fexofenadine HCl (ALLEGRA ALLERGY PO) Take by mouth.   fluticasone (FLONASE) 50 MCG/ACT nasal spray SPRAY 2 SPRAYS INTO EACH NOSTRIL EVERY DAY   loratadine (CLARITIN) 10 MG tablet Take 10 mg by mouth daily.   omeprazole (PRILOSEC) 20 MG capsule Take 20 mg by mouth daily.   predniSONE (STERAPRED UNI-PAK 21 TAB) 10 MG (21) TBPK tablet Use as directed on back of pill pack   [DISCONTINUED] calcium-vitamin D (OSCAL WITH D) 500-200 MG-UNIT tablet Take 1 tablet by mouth.   [DISCONTINUED]  famotidine (PEPCID) 10 MG tablet Take 10 mg by mouth 2 (two) times daily.   No facility-administered encounter medications on file as of 06/05/2022.    No Known Allergies  Review of Systems  Constitutional:  Negative for activity change, appetite change, chills, diaphoresis, fatigue, fever and unexpected weight change.  HENT:  Positive for congestion and ear pain. Negative for dental problem, drooling, ear discharge, facial swelling, hearing loss, mouth sores, nosebleeds, postnasal drip, rhinorrhea, sinus pressure, sinus pain, sneezing, sore throat, tinnitus, trouble swallowing and voice change.   Eyes: Negative.  Negative for photophobia and visual disturbance.  Respiratory:  Negative for cough, chest tightness and shortness of breath.   Cardiovascular:  Negative for chest pain, palpitations  and leg swelling.  Gastrointestinal:  Negative for abdominal pain, blood in stool, constipation, diarrhea, nausea and vomiting.  Endocrine: Negative.  Negative for polydipsia, polyphagia and polyuria.  Genitourinary:  Negative for decreased urine volume, difficulty urinating, dysuria, frequency and urgency.  Musculoskeletal:  Negative for arthralgias and myalgias.  Skin: Negative.   Allergic/Immunologic: Negative.   Neurological:  Negative for dizziness, tremors, seizures, syncope, facial asymmetry, speech difficulty, weakness, light-headedness, numbness and headaches.  Hematological: Negative.   Psychiatric/Behavioral:  Negative for confusion, hallucinations, sleep disturbance and suicidal ideas.   All other systems reviewed and are negative.       Objective:  BP 127/83   Pulse 97   Temp 98 F (36.7 C) (Temporal)   Ht 5\' 2"  (1.575 m)   Wt 118 lb (53.5 kg)   LMP 05/12/2022   SpO2 98%   BMI 21.58 kg/m    Wt Readings from Last 3 Encounters:  06/05/22 118 lb (53.5 kg)  09/10/21 126 lb 12 oz (57.5 kg)  11/22/20 122 lb (55.3 kg)    Physical Exam Vitals and nursing note reviewed.  Constitutional:      General: She is not in acute distress.    Appearance: Normal appearance. She is well-developed, well-groomed and normal weight. She is not ill-appearing, toxic-appearing or diaphoretic.  HENT:     Head: Normocephalic and atraumatic.     Jaw: There is normal jaw occlusion.     Right Ear: Hearing normal. A middle ear effusion is present. Tympanic membrane is not erythematous.     Left Ear: Hearing normal. A middle ear effusion is present. Tympanic membrane is not erythematous.     Ears:     Comments: Scarring on left TM    Nose: Nose normal.     Mouth/Throat:     Lips: Pink.     Mouth: Mucous membranes are moist.     Pharynx: Oropharynx is clear. Uvula midline.   Eyes:     General: Lids are normal.     Extraocular Movements: Extraocular movements intact.      Conjunctiva/sclera: Conjunctivae normal.     Pupils: Pupils are equal, round, and reactive to light.  Neck:     Thyroid: No thyroid mass, thyromegaly or thyroid tenderness.     Vascular: No carotid bruit or JVD.     Trachea: Trachea and phonation normal.  Cardiovascular:     Rate and Rhythm: Normal rate and regular rhythm.     Chest Wall: PMI is not displaced.     Pulses: Normal pulses.     Heart sounds: Normal heart sounds. No murmur heard.    No friction rub. No gallop.  Pulmonary:     Effort: Pulmonary effort is normal. No respiratory distress.     Breath sounds:  Normal breath sounds. No wheezing.  Abdominal:     General: There is no distension or abdominal bruit.     Palpations: There is no hepatomegaly or splenomegaly.  Musculoskeletal:        General: Normal range of motion.     Cervical back: Normal range of motion and neck supple.     Right lower leg: No edema.     Left lower leg: No edema.  Lymphadenopathy:     Cervical: No cervical adenopathy.  Skin:    General: Skin is warm and dry.     Capillary Refill: Capillary refill takes less than 2 seconds.     Coloration: Skin is not cyanotic, jaundiced or pale.     Findings: No rash.  Neurological:     General: No focal deficit present.     Mental Status: She is alert and oriented to person, place, and time.     Sensory: Sensation is intact.     Motor: Motor function is intact.     Coordination: Coordination is intact.     Gait: Gait is intact.     Deep Tendon Reflexes: Reflexes are normal and symmetric.  Psychiatric:        Attention and Perception: Attention and perception normal.        Mood and Affect: Mood and affect normal.        Speech: Speech normal.        Behavior: Behavior normal. Behavior is cooperative.        Thought Content: Thought content normal.        Cognition and Memory: Cognition and memory normal.        Judgment: Judgment normal.     Results for orders placed or performed in visit on  10/13/19  Alpha-Gal Panel  Result Value Ref Range   Beef (Bos spp) IgE <0.10 <0.35 kU/L   Class Interpretation 0    Lamb/Mutton (Ovis spp) IgE <0.10 <0.35 kU/L   Class Interpretation 0    Pork (Sus spp) IgE <0.10 <0.35 kU/L   Class Interpretation 0    Alpha Gal IgE* <0.10 <0.10 kU/L       Pertinent labs & imaging results that were available during my care of the patient were reviewed by me and considered in my medical decision making.  Assessment & Plan:  Sumayyah was seen today for new patient (initial visit), establish care and allergies.  Diagnoses and all orders for this visit:  Dysfunction of both eustachian tubes Pressure sensation in both ears Will burst with steroids to see if beneficial. Referral to ENT for reevaluation.  -     predniSONE (STERAPRED UNI-PAK 21 TAB) 10 MG (21) TBPK tablet; Use as directed on back of pill pack -     Ambulatory referral to ENT  Tonsillar cyst Referral to ENT for possible removal -     Ambulatory referral to ENT     Continue all other maintenance medications.  Follow up plan: Return in about 3 months (around 09/05/2022), or if symptoms worsen or fail to improve, for CPE.   Continue healthy lifestyle choices, including diet (rich in fruits, vegetables, and lean proteins, and low in salt and simple carbohydrates) and exercise (at least 30 minutes of moderate physical activity daily).  Educational handout given for allergies   The above assessment and management plan was discussed with the patient. The patient verbalized understanding of and has agreed to the management plan. Patient is aware to call the clinic if they  develop any new symptoms or if symptoms persist or worsen. Patient is aware when to return to the clinic for a follow-up visit. Patient educated on when it is appropriate to go to the emergency department.   Monia Pouch, FNP-C [facility] [phone]

## 2022-06-12 ENCOUNTER — Ambulatory Visit (INDEPENDENT_AMBULATORY_CARE_PROVIDER_SITE_OTHER): Payer: BC Managed Care – PPO

## 2022-06-12 DIAGNOSIS — J309 Allergic rhinitis, unspecified: Secondary | ICD-10-CM | POA: Diagnosis not present

## 2022-06-16 ENCOUNTER — Encounter: Payer: Self-pay | Admitting: Family Medicine

## 2022-07-01 ENCOUNTER — Ambulatory Visit (INDEPENDENT_AMBULATORY_CARE_PROVIDER_SITE_OTHER): Payer: BC Managed Care – PPO

## 2022-07-01 DIAGNOSIS — J309 Allergic rhinitis, unspecified: Secondary | ICD-10-CM

## 2022-07-22 ENCOUNTER — Ambulatory Visit (INDEPENDENT_AMBULATORY_CARE_PROVIDER_SITE_OTHER): Payer: BC Managed Care – PPO

## 2022-07-22 DIAGNOSIS — J309 Allergic rhinitis, unspecified: Secondary | ICD-10-CM | POA: Diagnosis not present

## 2022-07-29 ENCOUNTER — Ambulatory Visit (INDEPENDENT_AMBULATORY_CARE_PROVIDER_SITE_OTHER): Payer: BC Managed Care – PPO

## 2022-07-29 DIAGNOSIS — J309 Allergic rhinitis, unspecified: Secondary | ICD-10-CM | POA: Diagnosis not present

## 2022-08-07 ENCOUNTER — Ambulatory Visit (INDEPENDENT_AMBULATORY_CARE_PROVIDER_SITE_OTHER): Payer: BC Managed Care – PPO

## 2022-08-07 DIAGNOSIS — J309 Allergic rhinitis, unspecified: Secondary | ICD-10-CM

## 2022-08-12 ENCOUNTER — Ambulatory Visit (INDEPENDENT_AMBULATORY_CARE_PROVIDER_SITE_OTHER): Payer: BC Managed Care – PPO

## 2022-08-12 ENCOUNTER — Other Ambulatory Visit: Payer: Self-pay | Admitting: Physician Assistant

## 2022-08-12 ENCOUNTER — Ambulatory Visit
Admission: RE | Admit: 2022-08-12 | Discharge: 2022-08-12 | Disposition: A | Discharge status: Home / Self Care | Payer: BC Managed Care – PPO | Source: Ambulatory Visit | Attending: Physician Assistant | Admitting: Physician Assistant

## 2022-08-12 DIAGNOSIS — J358 Other chronic diseases of tonsils and adenoids: Secondary | ICD-10-CM | POA: Diagnosis not present

## 2022-08-12 DIAGNOSIS — J309 Allergic rhinitis, unspecified: Secondary | ICD-10-CM | POA: Diagnosis not present

## 2022-08-12 DIAGNOSIS — H938X3 Other specified disorders of ear, bilateral: Secondary | ICD-10-CM

## 2022-08-12 DIAGNOSIS — M542 Cervicalgia: Secondary | ICD-10-CM | POA: Diagnosis not present

## 2022-08-19 ENCOUNTER — Ambulatory Visit (INDEPENDENT_AMBULATORY_CARE_PROVIDER_SITE_OTHER): Payer: BC Managed Care – PPO

## 2022-08-19 DIAGNOSIS — J309 Allergic rhinitis, unspecified: Secondary | ICD-10-CM | POA: Diagnosis not present

## 2022-08-25 DIAGNOSIS — H6993 Unspecified Eustachian tube disorder, bilateral: Secondary | ICD-10-CM | POA: Insufficient documentation

## 2022-08-28 ENCOUNTER — Ambulatory Visit (INDEPENDENT_AMBULATORY_CARE_PROVIDER_SITE_OTHER): Payer: BC Managed Care – PPO

## 2022-08-28 DIAGNOSIS — J309 Allergic rhinitis, unspecified: Secondary | ICD-10-CM | POA: Diagnosis not present

## 2022-09-16 ENCOUNTER — Ambulatory Visit (INDEPENDENT_AMBULATORY_CARE_PROVIDER_SITE_OTHER): Payer: BC Managed Care – PPO | Admitting: *Deleted

## 2022-09-16 DIAGNOSIS — J309 Allergic rhinitis, unspecified: Secondary | ICD-10-CM | POA: Diagnosis not present

## 2022-10-09 ENCOUNTER — Ambulatory Visit (INDEPENDENT_AMBULATORY_CARE_PROVIDER_SITE_OTHER): Payer: BC Managed Care – PPO | Admitting: Family Medicine

## 2022-10-09 ENCOUNTER — Encounter: Payer: Self-pay | Admitting: Family Medicine

## 2022-10-09 ENCOUNTER — Ambulatory Visit (INDEPENDENT_AMBULATORY_CARE_PROVIDER_SITE_OTHER): Payer: BC Managed Care – PPO

## 2022-10-09 VITALS — BP 108/71 | HR 77 | Temp 97.9°F | Ht 62.0 in | Wt 123.4 lb

## 2022-10-09 DIAGNOSIS — E559 Vitamin D deficiency, unspecified: Secondary | ICD-10-CM

## 2022-10-09 DIAGNOSIS — Z0000 Encounter for general adult medical examination without abnormal findings: Secondary | ICD-10-CM | POA: Diagnosis not present

## 2022-10-09 DIAGNOSIS — Z130 Encounter for screening for diseases of the blood and blood-forming organs and certain disorders involving the immune mechanism: Secondary | ICD-10-CM

## 2022-10-09 DIAGNOSIS — Z131 Encounter for screening for diabetes mellitus: Secondary | ICD-10-CM | POA: Diagnosis not present

## 2022-10-09 DIAGNOSIS — Z1329 Encounter for screening for other suspected endocrine disorder: Secondary | ICD-10-CM | POA: Diagnosis not present

## 2022-10-09 DIAGNOSIS — Z0001 Encounter for general adult medical examination with abnormal findings: Secondary | ICD-10-CM | POA: Diagnosis not present

## 2022-10-09 DIAGNOSIS — H5203 Hypermetropia, bilateral: Secondary | ICD-10-CM | POA: Diagnosis not present

## 2022-10-09 DIAGNOSIS — Z13220 Encounter for screening for lipoid disorders: Secondary | ICD-10-CM

## 2022-10-09 DIAGNOSIS — H52223 Regular astigmatism, bilateral: Secondary | ICD-10-CM | POA: Diagnosis not present

## 2022-10-09 DIAGNOSIS — J309 Allergic rhinitis, unspecified: Secondary | ICD-10-CM | POA: Diagnosis not present

## 2022-10-09 DIAGNOSIS — H524 Presbyopia: Secondary | ICD-10-CM | POA: Diagnosis not present

## 2022-10-09 LAB — BAYER DCA HB A1C WAIVED: HB A1C (BAYER DCA - WAIVED): 5.2 % (ref 4.8–5.6)

## 2022-10-09 NOTE — Progress Notes (Signed)
Complete physical exam  Patient: Elizabeth Briggs   DOB: 10-28-1985   37 y.o. Female  MRN: [id number]  Subjective:    Chief Complaint  Patient presents with   Annual Exam    Elizabeth Briggs is a 37 y.o. female who presents today for a complete physical exam. She reports consuming a general diet.  Exercises daily.  She generally feels well. She reports sleeping well. She does not have additional problems to discuss today.    Most recent fall risk assessment:    10/09/2022    3:29 PM  Fall Risk   Falls in the past year? 0     Most recent depression screenings:    10/09/2022    3:29 PM 06/05/2022    3:10 PM  PHQ 2/9 Scores  PHQ - 2 Score 0 0  PHQ- 9 Score 0 0    Vision:Within last year and Dental: No current dental problems and Receives regular dental care  Patient Active Problem List   Diagnosis Date Noted   Acute dysfunction of Eustachian tube, bilateral 08/25/2022   Gastroesophageal reflux disease 09/10/2021   Dysfunction of both eustachian tubes 03/11/2019   Seasonal and perennial allergic rhinitis 10/12/2015   Past Medical History:  Diagnosis Date   Abnormal Pap smear of cervix 2004   Allergies    Eczema    Past Surgical History:  Procedure Laterality Date   COLPOSCOPY     MOUTH SURGERY     TYMPANOSTOMY TUBE PLACEMENT     Social History   Tobacco Use   Smoking status: Former    Current packs/day: 0.00    Types: Cigarettes    Start date: 2003    Quit date: 2016    Years since quitting: 8.7   Smokeless tobacco: Never  Vaping Use   Vaping status: Never Used  Substance Use Topics   Alcohol use: No   Drug use: No   Social History   Socioeconomic History   Marital status: Married    Spouse name: Not on file   Number of children: 1   Years of education: Not on file   Highest education level: Not on file  Occupational History   Not on file  Tobacco Use   Smoking status: Former    Current packs/day: 0.00    Types: Cigarettes    Start date: 2003     Quit date: 2016    Years since quitting: 8.7   Smokeless tobacco: Never  Vaping Use   Vaping status: Never Used  Substance and Sexual Activity   Alcohol use: No   Drug use: No   Sexual activity: Yes    Partners: Male    Birth control/protection: None  Other Topics Concern   Not on file  Social History Narrative   Not on file   Social Determinants of Health   Financial Resource Strain: Not on file  Food Insecurity: Low Risk  (08/12/2022)   Received from [facility]   Hunger Vital Sign    Worried About Running Out of Food in the Last Year: Never true    Ran Out of Food in the Last Year: Never true  Transportation Needs: Not on file (08/12/2022)  Physical Activity: Not on file  Stress: Not on file  Social Connections: Unknown (05/30/2021)   Received from [facility], [facility]   Social Network    Social Network: Not on file  Intimate Partner Violence: Unknown (05/01/2021)   Received from [facility], [facility]  HITS    Physically Hurt: Not on file    Insult or Talk Down To: Not on file    Threaten Physical Harm: Not on file    Scream or Curse: Not on file   Family Status  Relation Name Status   Mother  Alive   Father  Alive   Sister  Alive   Brother  Alive   Daughter  Alive   MGM  Alive   MGF  Deceased   PGM  Deceased  No partnership data on file   Family History  Problem Relation Age of Onset   Hyperlipidemia Mother    Endometriosis Mother    Allergic rhinitis Mother    Prostate cancer Father 25   Eczema Father    Diabetes type I Brother    Diabetes Maternal Grandmother    Anuerysm Paternal Grandmother    No Known Allergies    Patient Care Team: Earnstine Meinders, Doralee Albino, FNP as PCP - General (Family Medicine)   Outpatient Medications Prior to Visit  Medication Sig   Cetirizine HCl 10 MG CAPS Take 1 capsule by mouth daily.   EPINEPHrine (AUVI-Q) 0.3 mg/0.3 mL IJ SOAJ injection Inject 0.3 mg into the muscle as needed for anaphylaxis.   Fexofenadine  HCl (ALLEGRA ALLERGY PO) Take by mouth.   fluticasone (FLONASE) 50 MCG/ACT nasal spray SPRAY 2 SPRAYS INTO EACH NOSTRIL EVERY DAY   loratadine (CLARITIN) 10 MG tablet Take 10 mg by mouth daily.   omeprazole (PRILOSEC) 20 MG capsule Take 20 mg by mouth daily.   [DISCONTINUED] predniSONE (STERAPRED UNI-PAK 21 TAB) 10 MG (21) TBPK tablet Use as directed on back of pill pack   No facility-administered medications prior to visit.    Review of Systems  Constitutional:  Negative for chills, diaphoresis, fever, malaise/fatigue and weight loss.  HENT:  Positive for ear pain (improving). Negative for congestion, ear discharge, hearing loss, nosebleeds, sinus pain, sore throat and tinnitus.   Respiratory:  Negative for stridor.   Gastrointestinal:  Negative for abdominal pain, blood in stool, constipation, diarrhea, heartburn, melena, nausea and vomiting.  Genitourinary:  Negative for dysuria, flank pain, frequency, hematuria and urgency.  All other systems reviewed and are negative.         Objective:     BP 108/71   Pulse 77   Temp 97.9 F (36.6 C) (Temporal)   Ht 5\' 2"  (1.575 m)   Wt 123 lb 6.4 oz (56 kg)   LMP 09/15/2022   SpO2 99%   BMI 22.57 kg/m  BP Readings from Last 3 Encounters:  10/09/22 108/71  06/05/22 127/83  09/10/21 104/80   Wt Readings from Last 3 Encounters:  10/09/22 123 lb 6.4 oz (56 kg)  06/05/22 118 lb (53.5 kg)  09/10/21 126 lb 12 oz (57.5 kg)   SpO2 Readings from Last 3 Encounters:  10/09/22 99%  06/05/22 98%  09/10/21 99%      Physical Exam Vitals and nursing note reviewed.  Constitutional:      General: She is not in acute distress.    Appearance: Normal appearance. She is well-developed and well-groomed. She is not ill-appearing, toxic-appearing or diaphoretic.  HENT:     Head: Normocephalic and atraumatic.     Jaw: There is normal jaw occlusion.     Right Ear: Hearing, tympanic membrane, ear canal and external ear normal.     Left Ear:  Hearing, tympanic membrane, ear canal and external ear normal.     Nose: Nose normal.  Mouth/Throat:     Lips: Pink.     Mouth: Mucous membranes are moist.     Pharynx: Oropharynx is clear. Uvula midline.  Eyes:     General: Lids are normal.     Extraocular Movements: Extraocular movements intact.     Conjunctiva/sclera: Conjunctivae normal.     Pupils: Pupils are equal, round, and reactive to light.  Neck:     Thyroid: No thyroid mass, thyromegaly or thyroid tenderness.     Vascular: No carotid bruit or JVD.     Trachea: Trachea and phonation normal.  Cardiovascular:     Rate and Rhythm: Normal rate and regular rhythm.     Chest Wall: PMI is not displaced.     Pulses: Normal pulses.     Heart sounds: Normal heart sounds. No murmur heard.    No friction rub. No gallop.  Pulmonary:     Effort: Pulmonary effort is normal. No respiratory distress.     Breath sounds: Normal breath sounds. No wheezing.  Abdominal:     General: Bowel sounds are normal. There is no distension or abdominal bruit.     Palpations: Abdomen is soft. There is no hepatomegaly or splenomegaly.     Tenderness: There is no abdominal tenderness. There is no right CVA tenderness or left CVA tenderness.     Hernia: No hernia is present.  Genitourinary:    Comments: Deferred, sees GYN Musculoskeletal:        General: Normal range of motion.     Cervical back: Normal range of motion and neck supple.     Right lower leg: No edema.     Left lower leg: No edema.  Lymphadenopathy:     Cervical: No cervical adenopathy.  Skin:    General: Skin is warm and dry.     Capillary Refill: Capillary refill takes less than 2 seconds.     Coloration: Skin is not cyanotic, jaundiced or pale.     Findings: No rash.  Neurological:     General: No focal deficit present.     Mental Status: She is alert and oriented to person, place, and time.     Sensory: Sensation is intact.     Motor: Motor function is intact.      Coordination: Coordination is intact.     Gait: Gait is intact.     Deep Tendon Reflexes: Reflexes are normal and symmetric.  Psychiatric:        Attention and Perception: Attention and perception normal.        Mood and Affect: Mood and affect normal.        Speech: Speech normal.        Behavior: Behavior normal. Behavior is cooperative.        Thought Content: Thought content normal.        Cognition and Memory: Cognition and memory normal.        Judgment: Judgment normal.      Last CBC Lab Results  Component Value Date   WBC 4.5 05/10/2019   HGB 13.3 05/10/2019   HCT 40.1 05/10/2019   MCV 87.6 05/10/2019   MCH 29.0 05/10/2019   RDW 12.6 05/10/2019   PLT 217 05/10/2019   Last metabolic panel Lab Results  Component Value Date   GLUCOSE 102 (H) 05/10/2019   NA 137 05/10/2019   K 4.5 05/10/2019   CL 104 05/10/2019   CO2 27 05/10/2019   BUN 13 05/10/2019   CREATININE 0.76 05/10/2019  GFRNONAA 102 05/10/2019   CALCIUM 9.5 05/10/2019   PROT 6.9 05/10/2019   BILITOT 0.4 05/10/2019   AST 21 05/10/2019   ALT 20 05/10/2019   Last lipids Lab Results  Component Value Date   CHOL 205 (H) 05/10/2019   HDL 79 05/10/2019   LDLCALC 111 (H) 05/10/2019   TRIG 61 05/10/2019   CHOLHDL 2.6 05/10/2019   Last hemoglobin A1c Lab Results  Component Value Date   HGBA1C 5.4 05/10/2019   Last thyroid functions Lab Results  Component Value Date   TSH 1.32 05/10/2019        Assessment & Plan:    Routine Health Maintenance and Physical Exam  Immunization History  Administered Date(s) Administered   Influenza,inj,Quad PF,6+ Mos 12/13/2014, 12/22/2017   Tdap 01/31/2015    Health Maintenance  Topic Date Due   PAP SMEAR-Modifier  06/28/2022   COVID-19 Vaccine (1 - 2023-24 season) 10/25/2022 (Originally 09/28/2022)   INFLUENZA VACCINE  04/27/2023 (Originally 08/28/2022)   Hepatitis C Screening  06/05/2023 (Originally 05/11/2002)   DTaP/Tdap/Td (2 - Td or Tdap) 01/30/2025    HIV Screening  Completed   HPV VACCINES  Aged Out    Discussed health benefits of physical activity, and encouraged her to engage in regular exercise appropriate for her age and condition.  Problem List Items Addressed This Visit   None Visit Diagnoses     Annual physical exam    -  Primary   Relevant Orders   Anemia Profile B   CMP14+EGFR   Lipid panel   Thyroid Panel With TSH   Bayer DCA Hb A1c Waived   Screening for lipid disorders       Relevant Orders   Lipid panel   Screening for deficiency anemia       Relevant Orders   Anemia Profile B   Screening for diabetes mellitus       Relevant Orders   CMP14+EGFR   Bayer DCA Hb A1c Waived   Screening for endocrine disorder       Relevant Orders   CMP14+EGFR   Thyroid Panel With TSH   Vitamin D deficiency       Relevant Orders   VITAMIN D 25 Hydroxy (Vit-D Deficiency, Fractures)      Return in about 1 year (around 10/09/2023) for CPE.     Kari Baars, FNP

## 2022-10-10 LAB — ANEMIA PROFILE B
Basophils Absolute: 0.1 10*3/uL (ref 0.0–0.2)
Basos: 1 %
EOS (ABSOLUTE): 0.1 10*3/uL (ref 0.0–0.4)
Eos: 2 %
Ferritin: 49 ng/mL (ref 15–150)
Folate: 15.3 ng/mL (ref >3.0)
Hematocrit: 39.2 % (ref 34.0–46.6)
Hemoglobin: 12.9 g/dL (ref 11.1–15.9)
Immature Grans (Abs): 0 10*3/uL (ref 0.0–0.1)
Immature Granulocytes: 0 %
Iron Saturation: 22 % (ref 15–55)
Iron: 79 ug/dL (ref 27–159)
Lymphocytes Absolute: 1.5 10*3/uL (ref 0.7–3.1)
Lymphs: 29 %
MCH: 29.1 pg (ref 26.6–33.0)
MCHC: 32.9 g/dL (ref 31.5–35.7)
MCV: 89 fL (ref 79–97)
Monocytes Absolute: 0.4 10*3/uL (ref 0.1–0.9)
Monocytes: 8 %
Neutrophils Absolute: 3 10*3/uL (ref 1.4–7.0)
Neutrophils: 60 %
Platelets: 231 10*3/uL (ref 150–450)
RBC: 4.43 x10E6/uL (ref 3.77–5.28)
RDW: 12.6 % (ref 11.7–15.4)
Retic Ct Pct: 1.1 % (ref 0.6–2.6)
Total Iron Binding Capacity: 365 ug/dL (ref 250–450)
UIBC: 286 ug/dL (ref 131–425)
Vitamin B-12: 781 pg/mL (ref 232–1245)
WBC: 5 10*3/uL (ref 3.4–10.8)

## 2022-10-10 LAB — THYROID PANEL WITH TSH
Free Thyroxine Index: 1.9 (ref 1.2–4.9)
T3 Uptake Ratio: 27 % (ref 24–39)
T4, Total: 7 ug/dL (ref 4.5–12.0)
TSH: 1.26 u[IU]/mL (ref 0.450–4.500)

## 2022-10-10 LAB — CMP14+EGFR
ALT: 13 IU/L (ref 0–32)
AST: 18 IU/L (ref 0–40)
Albumin: 4.8 g/dL (ref 3.9–4.9)
Alkaline Phosphatase: 49 IU/L (ref 44–121)
BUN/Creatinine Ratio: 15 (ref 9–23)
BUN: 11 mg/dL (ref 6–20)
Bilirubin Total: 0.4 mg/dL (ref 0.0–1.2)
CO2: 20 mmol/L (ref 20–29)
Calcium: 9.7 mg/dL (ref 8.7–10.2)
Chloride: 100 mmol/L (ref 96–106)
Creatinine, Ser: 0.73 mg/dL (ref 0.57–1.00)
Globulin, Total: 2.4 g/dL (ref 1.5–4.5)
Glucose: 87 mg/dL (ref 70–99)
Potassium: 3.8 mmol/L (ref 3.5–5.2)
Sodium: 138 mmol/L (ref 134–144)
Total Protein: 7.2 g/dL (ref 6.0–8.5)
eGFR: 108 mL/min/{1.73_m2} (ref >59)

## 2022-10-10 LAB — LIPID PANEL
Chol/HDL Ratio: 3.2 ratio (ref 0.0–4.4)
Cholesterol, Total: 222 mg/dL — ABNORMAL HIGH (ref 100–199)
HDL: 69 mg/dL (ref >39)
LDL Chol Calc (NIH): 137 mg/dL — ABNORMAL HIGH (ref 0–99)
Triglycerides: 91 mg/dL (ref 0–149)
VLDL Cholesterol Cal: 16 mg/dL (ref 5–40)

## 2022-10-10 LAB — VITAMIN D 25 HYDROXY (VIT D DEFICIENCY, FRACTURES): Vit D, 25-Hydroxy: 38.1 ng/mL (ref 30.0–100.0)

## 2022-10-28 ENCOUNTER — Ambulatory Visit (INDEPENDENT_AMBULATORY_CARE_PROVIDER_SITE_OTHER): Payer: BC Managed Care – PPO

## 2022-10-28 DIAGNOSIS — J309 Allergic rhinitis, unspecified: Secondary | ICD-10-CM | POA: Diagnosis not present

## 2022-11-13 DIAGNOSIS — J301 Allergic rhinitis due to pollen: Secondary | ICD-10-CM | POA: Diagnosis not present

## 2022-11-13 NOTE — Progress Notes (Signed)
VIALS EXP 11-13-23

## 2022-11-18 ENCOUNTER — Ambulatory Visit (INDEPENDENT_AMBULATORY_CARE_PROVIDER_SITE_OTHER): Payer: BC Managed Care – PPO

## 2022-11-18 DIAGNOSIS — J309 Allergic rhinitis, unspecified: Secondary | ICD-10-CM | POA: Diagnosis not present

## 2022-11-25 ENCOUNTER — Ambulatory Visit: Payer: BC Managed Care – PPO | Admitting: Obstetrics and Gynecology

## 2022-11-25 ENCOUNTER — Encounter: Payer: Self-pay | Admitting: Obstetrics and Gynecology

## 2022-11-25 ENCOUNTER — Other Ambulatory Visit (HOSPITAL_COMMUNITY)
Admission: RE | Admit: 2022-11-25 | Discharge: 2022-11-25 | Disposition: A | Discharge status: Home / Self Care | Payer: BC Managed Care – PPO | Source: Ambulatory Visit | Attending: Obstetrics and Gynecology | Admitting: Obstetrics and Gynecology

## 2022-11-25 VITALS — BP 121/80 | HR 70 | Ht 62.0 in | Wt 123.0 lb

## 2022-11-25 DIAGNOSIS — Z01419 Encounter for gynecological examination (general) (routine) without abnormal findings: Secondary | ICD-10-CM | POA: Diagnosis not present

## 2022-11-25 NOTE — Progress Notes (Signed)
GYNECOLOGY ANNUAL PREVENTATIVE CARE ENCOUNTER NOTE  History:     Elizabeth Briggs is a 37 y.o. G35P1001 female here for a routine annual gynecologic exam.  Current complaints: None.   Denies abnormal vaginal bleeding, discharge, pelvic pain, problems with intercourse or other gynecologic concerns.    Gynecologic History Patient's last menstrual period was 11/03/2022. Contraception: none Last Pap: 2021. Result was normal with negative HPV Last Mammogram: NA. age. Last Colonoscopy: NA  Obstetric History OB History  Gravida Para Term Preterm AB Living  1 1 1  0 0 1  SAB IAB Ectopic Multiple Live Births  0 0 0 0 1    # Outcome Date GA Lbr Len/2nd Weight Sex Type Anes PTL Lv  1 Term 04/30/15 [redacted]w[redacted]d 14:46 / 02:22 7 lb 6.9 oz (3.37 kg) F Vag-Spont EPI  LIV    Past Medical History:  Diagnosis Date   Abnormal Pap smear of cervix 2004   Allergies    Eczema     Past Surgical History:  Procedure Laterality Date   COLPOSCOPY     MOUTH SURGERY     TYMPANOSTOMY TUBE PLACEMENT      Current Outpatient Medications on File Prior to Visit  Medication Sig Dispense Refill   Cetirizine HCl 10 MG CAPS Take 1 capsule by mouth daily.     EPINEPHrine (AUVI-Q) 0.3 mg/0.3 mL IJ SOAJ injection Inject 0.3 mg into the muscle as needed for anaphylaxis. 2 each 1   Fexofenadine HCl (ALLEGRA ALLERGY PO) Take by mouth.     fluticasone (FLONASE) 50 MCG/ACT nasal spray SPRAY 2 SPRAYS INTO EACH NOSTRIL EVERY DAY     loratadine (CLARITIN) 10 MG tablet Take 10 mg by mouth daily.     omeprazole (PRILOSEC) 20 MG capsule Take 20 mg by mouth daily.     No current facility-administered medications on file prior to visit.    No Known Allergies  Social History:  reports that she quit smoking about 8 years ago. Her smoking use included cigarettes. She started smoking about 21 years ago. She has never used smokeless tobacco. She reports that she does not drink alcohol and does not use drugs.  Family History   Problem Relation Age of Onset   Hyperlipidemia Mother    Endometriosis Mother    Allergic rhinitis Mother    Prostate cancer Father 36   Eczema Father    Diabetes type I Brother    Diabetes Maternal Grandmother    Anuerysm Paternal Grandmother     The following portions of the patient's history were reviewed and updated as appropriate: allergies, current medications, past family history, past medical history, past social history, past surgical history and problem list.  Review of Systems Pertinent items noted in HPI and remainder of comprehensive ROS otherwise negative.  Physical Exam:  BP 121/80   Pulse 70   Ht 5\' 2"  (1.575 m)   Wt 123 lb (55.8 kg)   LMP 11/03/2022   BMI 22.50 kg/m  CONSTITUTIONAL: Well-developed, well-nourished female in no acute distress.  HENT:  Normocephalic, atraumatic, External right and left ear normal.  EYES: Conjunctivae and EOM are normal. Pupils are equal, round, and reactive to light. No scleral icterus.  NECK: Normal range of motion, supple, no masses.  Normal thyroid.  SKIN: Skin is warm and dry. No rash noted. Not diaphoretic. No erythema. No pallor. MUSCULOSKELETAL: Normal range of motion. No tenderness.  No cyanosis, clubbing, or edema. NEUROLOGIC: Alert and oriented to person, place, and time.  Normal reflexes, muscle tone coordination.  PSYCHIATRIC: Normal mood and affect. Normal behavior. Normal judgment and thought content. CARDIOVASCULAR: Normal heart rate noted, regular rhythm RESPIRATORY: Clear to auscultation bilaterally. Effort and breath sounds normal, no problems with respiration noted. BREASTS: Symmetric in size. No masses, tenderness, skin changes, nipple drainage, or lymphadenopathy bilaterally. Performed in the presence of a chaperone. ABDOMEN: Soft, no distention noted.  No tenderness, rebound or guarding.  PELVIC: Normal appearing external genitalia and urethral meatus; normal appearing vaginal mucosa, Cervical ectropion noted.   No abnormal vaginal discharge noted.  Pap smear obtained.  Normal uterine size, no other palpable masses, no uterine or adnexal tenderness.  Performed in the presence of a chaperone.   Assessment and Plan:   1. Well woman exam  - Cytology - PAP( [place])  - Patient reassured about findings of cervical ectropion during examination.  No other anomalies noted.  - Continue care with PCP - Declines Flu shot and birth control.     Will follow up results of pap smear and manage accordingly. Normal breast examination today, she was advised to perform periodic self breast examinations.  Routine preventative health maintenance measures emphasized. Please refer to After Visit Summary for other counseling recommendations.    Artie Mcintyre, Harolyn Rutherford, NP  [facility] [employer], [facility]

## 2022-11-26 LAB — CYTOLOGY - PAP
Comment: NEGATIVE
Diagnosis: NEGATIVE
High risk HPV: NEGATIVE

## 2022-12-03 ENCOUNTER — Ambulatory Visit (INDEPENDENT_AMBULATORY_CARE_PROVIDER_SITE_OTHER): Payer: Self-pay

## 2022-12-03 DIAGNOSIS — J309 Allergic rhinitis, unspecified: Secondary | ICD-10-CM | POA: Diagnosis not present

## 2022-12-09 ENCOUNTER — Ambulatory Visit (INDEPENDENT_AMBULATORY_CARE_PROVIDER_SITE_OTHER): Payer: BC Managed Care – PPO

## 2022-12-09 DIAGNOSIS — J309 Allergic rhinitis, unspecified: Secondary | ICD-10-CM | POA: Diagnosis not present

## 2022-12-15 NOTE — Progress Notes (Unsigned)
Follow Up Note  RE: Elizabeth Briggs MRN: [id number] DOB: January 13, 1986 Date of Office Visit: 12/16/2022  Referring provider: Sonny Masters, FNP Primary care provider: Sonny Masters, FNP  Chief Complaint: No chief complaint on file.  History of Present Illness: I had the pleasure of seeing Elizabeth Briggs for a follow up visit at the [facility] on 12/15/2022. She is a 37 y.o. female, who is being followed for allergic rhinitis on AIT, eustachian tube dysfunction, GERD. Her previous allergy office visit was on 09/10/2021 with Dr. Selena Batten. Today is a regular follow up visit.  Discussed the use of AI scribe software for clinical note transcription with the patient, who gave verbal consent to proceed.  History of Present Illness            Seasonal and perennial allergic rhinitis Past history - Rhinitis symptoms with ear pain and pressure for the past 9 months. Seen by ENT who referred her to allergy for testing. Tried Flonase, Azelastine, zyrtec and oral decongestants with some benefit. 2021 skin testing showed: Positive to grass, ragweed, weed, trees, dust mites, cat. started AIT on 08/02/2019 (G-W-RW-T & DM-C-CR) Interim history - doing well with AIT. Ryaltris not more effective than Flonase.  Continue environmental control measures.  Continue allergy injections - will go every 3 weeks with the new red vial.  Double up on the antihistamines the day of injections.  You can take Zyrtec, Claritin, Allegra or Xyzal.  Take Singulair 10mg  the day before the injection. May use topical hydrocortisone cream as needed on the rash.  Use Flonase (fluticasone) nasal spray 2 spray per nostril once a day as needed for nasal congestion.  Nasal saline spray (i.e., Simply Saline) or nasal saline lavage (i.e., NeilMed) is recommended as needed and prior to medicated nasal sprays.   Dysfunction of both eustachian tubes Past history - ENT apparently "poked" a hole in the past with no  benefit. Interim history - unchanged. Recommend ENT evaluation again.   Gastroesophageal reflux disease Controlled with no recent flares.  Continue lifestyle and dietary modifications. May use famotidine 20mg  1-2 times a day as needed. May use Prilosec 20mg  daily a day as needed.    Return in about 1 year (around 09/11/2022).  Assessment and Plan: Elizabeth Briggs is a 37 y.o. female with: Seasonal allergic rhinitis due to pollen Allergic rhinitis due to dust mite Allergic rhinitis due to animal dander Allergy to cockroaches Past history - Rhinitis symptoms with ear pain and pressure for the past 9 months. Seen by ENT who referred her to allergy for testing. Tried Flonase, Azelastine, zyrtec and oral decongestants with some benefit. 2021 skin testing showed: Positive to grass, ragweed, weed, trees, dust mites, cat. started AIT on 08/02/2019 (G-W-RW-T & DM-C-CR) Interim history -  Gastroesophageal reflux disease, unspecified whether esophagitis present ***  Assessment and Plan              No follow-ups on file.  No orders of the defined types were placed in this encounter.  Lab Orders  No laboratory test(s) ordered today    Diagnostics: Spirometry:  Tracings reviewed. Her effort: {Blank single:19197::"Good reproducible efforts.","It was hard to get consistent efforts and there is a question as to whether this reflects a maximal maneuver.","Poor effort, data can not be interpreted."} FVC: ***L FEV1: ***L, ***% predicted FEV1/FVC ratio: ***% Interpretation: {Blank single:19197::"Spirometry consistent with mild obstructive disease","Spirometry consistent with moderate obstructive disease","Spirometry consistent with severe obstructive disease","Spirometry consistent with possible restrictive  disease","Spirometry consistent with mixed obstructive and restrictive disease","Spirometry uninterpretable due to technique","Spirometry consistent with normal pattern","No overt abnormalities noted  given today's efforts"}.  Please see scanned spirometry results for details.  Skin Testing: {Blank single:19197::"Select foods","Environmental allergy panel","Environmental allergy panel and select foods","Food allergy panel","None","Deferred due to recent antihistamines use"}. *** Results discussed with patient/family.   Medication List:  Current Outpatient Medications  Medication Sig Dispense Refill  . Cetirizine HCl 10 MG CAPS Take 1 capsule by mouth daily.    Marland Kitchen EPINEPHrine (AUVI-Q) 0.3 mg/0.3 mL IJ SOAJ injection Inject 0.3 mg into the muscle as needed for anaphylaxis. 2 each 1  . Fexofenadine HCl (ALLEGRA ALLERGY PO) Take by mouth.    . fluticasone (FLONASE) 50 MCG/ACT nasal spray SPRAY 2 SPRAYS INTO EACH NOSTRIL EVERY DAY    . loratadine (CLARITIN) 10 MG tablet Take 10 mg by mouth daily.    Marland Kitchen omeprazole (PRILOSEC) 20 MG capsule Take 20 mg by mouth daily.     No current facility-administered medications for this visit.   Allergies: No Known Allergies I reviewed her past medical history, social history, family history, and environmental history and no significant changes have been reported from her previous visit.  Review of Systems  Constitutional:  Negative for appetite change, chills, fever and unexpected weight change.  HENT:  Positive for sinus pressure. Negative for congestion, postnasal drip and sneezing.        Ear fullness  Eyes:  Negative for itching.  Respiratory:  Negative for cough, chest tightness, shortness of breath and wheezing.   Cardiovascular:  Negative for chest pain.  Gastrointestinal:  Negative for abdominal pain.  Genitourinary:  Negative for difficulty urinating.  Skin:  Negative for rash.  Allergic/Immunologic: Positive for environmental allergies.   Objective: LMP 11/03/2022  There is no height or weight on file to calculate BMI. Physical Exam Vitals and nursing note reviewed.  Constitutional:      Appearance: Normal appearance. She is  well-developed.  HENT:     Head: Normocephalic and atraumatic.     Right Ear: Tympanic membrane and external ear normal.     Left Ear: Tympanic membrane and external ear normal.     Nose: Nose normal.     Mouth/Throat:     Mouth: Mucous membranes are moist.     Pharynx: Oropharynx is clear.  Eyes:     Conjunctiva/sclera: Conjunctivae normal.  Cardiovascular:     Rate and Rhythm: Normal rate and regular rhythm.     Heart sounds: Normal heart sounds. No murmur heard.    No friction rub. No gallop.  Pulmonary:     Effort: Pulmonary effort is normal.     Breath sounds: Normal breath sounds. No wheezing, rhonchi or rales.  Musculoskeletal:     Cervical back: Neck supple.  Skin:    General: Skin is warm.     Findings: No rash.  Neurological:     Mental Status: She is alert and oriented to person, place, and time.  Psychiatric:        Mood and Affect: Mood normal.        Behavior: Behavior normal.  Previous notes and tests were reviewed. The plan was reviewed with the patient/family, and all questions/concerned were addressed.  It was my pleasure to see Adamariz today and participate in her care. Please feel free to contact me with any questions or concerns.  Sincerely,  Wyline Mood, DO Allergy & Immunology  [facility] office: [phone]  [place] office: [phone]

## 2022-12-16 ENCOUNTER — Encounter: Payer: Self-pay | Admitting: Allergy

## 2022-12-16 ENCOUNTER — Ambulatory Visit (INDEPENDENT_AMBULATORY_CARE_PROVIDER_SITE_OTHER): Payer: BC Managed Care – PPO | Admitting: Allergy

## 2022-12-16 VITALS — BP 110/66 | HR 73 | Temp 97.7°F | Resp 16 | Ht 63.0 in | Wt 130.5 lb

## 2022-12-16 DIAGNOSIS — Z91038 Other insect allergy status: Secondary | ICD-10-CM | POA: Diagnosis not present

## 2022-12-16 DIAGNOSIS — J3089 Other allergic rhinitis: Secondary | ICD-10-CM | POA: Diagnosis not present

## 2022-12-16 DIAGNOSIS — J3081 Allergic rhinitis due to animal (cat) (dog) hair and dander: Secondary | ICD-10-CM | POA: Diagnosis not present

## 2022-12-16 DIAGNOSIS — K219 Gastro-esophageal reflux disease without esophagitis: Secondary | ICD-10-CM

## 2022-12-16 DIAGNOSIS — H6993 Unspecified Eustachian tube disorder, bilateral: Secondary | ICD-10-CM

## 2022-12-16 DIAGNOSIS — J301 Allergic rhinitis due to pollen: Secondary | ICD-10-CM

## 2022-12-16 NOTE — Patient Instructions (Addendum)
Environmental allergies 2021 skin testing positive to grass, ragweed, weed, trees, dust mites, cat. Continue environmental control measures.  Continue allergy injections - will go every 4 weeks. Will build up new red vials faster (0.1, 0.3, 0.5) May use topical hydrocortisone cream as needed on the rash.  Use over the counter antihistamines such as Zyrtec (cetirizine), Claritin (loratadine), Allegra (fexofenadine), or Xyzal (levocetirizine) daily as needed. May take twice a day during allergy flares. May switch antihistamines every few months.  Use Flonase (fluticasone) nasal spray 1-2 sprays per nostril once a day as needed for nasal congestion.  Nasal saline spray (i.e., Simply Saline) or nasal saline lavage (i.e., NeilMed) is recommended as needed and prior to medicated nasal sprays. Will discuss next year when to stop the allergy injections.   Ears: Recommend TMJ specialist next.   Reflux: Continue lifestyle and dietary modifications. May use Prilosec 20mg  daily a day as needed.   Follow up in 12 months or sooner if needed.

## 2023-01-06 ENCOUNTER — Ambulatory Visit (INDEPENDENT_AMBULATORY_CARE_PROVIDER_SITE_OTHER): Payer: BC Managed Care – PPO

## 2023-01-06 DIAGNOSIS — J309 Allergic rhinitis, unspecified: Secondary | ICD-10-CM | POA: Diagnosis not present

## 2023-02-03 ENCOUNTER — Ambulatory Visit: Payer: BC Managed Care – PPO

## 2023-02-03 DIAGNOSIS — J309 Allergic rhinitis, unspecified: Secondary | ICD-10-CM

## 2023-02-10 ENCOUNTER — Ambulatory Visit (INDEPENDENT_AMBULATORY_CARE_PROVIDER_SITE_OTHER): Payer: BC Managed Care – PPO

## 2023-02-10 DIAGNOSIS — J309 Allergic rhinitis, unspecified: Secondary | ICD-10-CM

## 2023-02-17 ENCOUNTER — Ambulatory Visit (INDEPENDENT_AMBULATORY_CARE_PROVIDER_SITE_OTHER): Payer: BC Managed Care – PPO

## 2023-02-17 DIAGNOSIS — J309 Allergic rhinitis, unspecified: Secondary | ICD-10-CM

## 2023-03-17 ENCOUNTER — Ambulatory Visit (INDEPENDENT_AMBULATORY_CARE_PROVIDER_SITE_OTHER): Payer: BC Managed Care – PPO

## 2023-03-17 DIAGNOSIS — J309 Allergic rhinitis, unspecified: Secondary | ICD-10-CM | POA: Diagnosis not present

## 2023-04-14 ENCOUNTER — Ambulatory Visit (INDEPENDENT_AMBULATORY_CARE_PROVIDER_SITE_OTHER)

## 2023-04-14 DIAGNOSIS — J309 Allergic rhinitis, unspecified: Secondary | ICD-10-CM | POA: Diagnosis not present

## 2023-05-14 ENCOUNTER — Ambulatory Visit (INDEPENDENT_AMBULATORY_CARE_PROVIDER_SITE_OTHER)

## 2023-05-14 DIAGNOSIS — J309 Allergic rhinitis, unspecified: Secondary | ICD-10-CM | POA: Diagnosis not present

## 2023-06-11 ENCOUNTER — Ambulatory Visit (INDEPENDENT_AMBULATORY_CARE_PROVIDER_SITE_OTHER)

## 2023-06-11 DIAGNOSIS — J309 Allergic rhinitis, unspecified: Secondary | ICD-10-CM

## 2023-06-23 NOTE — Progress Notes (Signed)
 VIALS MADE 06-23-23

## 2023-06-25 DIAGNOSIS — J301 Allergic rhinitis due to pollen: Secondary | ICD-10-CM | POA: Diagnosis not present

## 2023-06-26 DIAGNOSIS — J3089 Other allergic rhinitis: Secondary | ICD-10-CM | POA: Diagnosis not present

## 2023-07-07 ENCOUNTER — Ambulatory Visit (INDEPENDENT_AMBULATORY_CARE_PROVIDER_SITE_OTHER)

## 2023-07-07 DIAGNOSIS — J309 Allergic rhinitis, unspecified: Secondary | ICD-10-CM | POA: Diagnosis not present

## 2023-08-06 ENCOUNTER — Ambulatory Visit (INDEPENDENT_AMBULATORY_CARE_PROVIDER_SITE_OTHER)

## 2023-08-06 DIAGNOSIS — J309 Allergic rhinitis, unspecified: Secondary | ICD-10-CM

## 2023-09-01 ENCOUNTER — Ambulatory Visit (INDEPENDENT_AMBULATORY_CARE_PROVIDER_SITE_OTHER)

## 2023-09-01 DIAGNOSIS — J309 Allergic rhinitis, unspecified: Secondary | ICD-10-CM

## 2023-09-29 ENCOUNTER — Ambulatory Visit (INDEPENDENT_AMBULATORY_CARE_PROVIDER_SITE_OTHER)

## 2023-09-29 DIAGNOSIS — J309 Allergic rhinitis, unspecified: Secondary | ICD-10-CM

## 2023-10-16 ENCOUNTER — Encounter: Payer: BC Managed Care – PPO | Admitting: Family Medicine

## 2023-10-27 ENCOUNTER — Ambulatory Visit (INDEPENDENT_AMBULATORY_CARE_PROVIDER_SITE_OTHER)

## 2023-10-27 DIAGNOSIS — J309 Allergic rhinitis, unspecified: Secondary | ICD-10-CM

## 2023-11-05 ENCOUNTER — Ambulatory Visit

## 2023-11-05 DIAGNOSIS — J309 Allergic rhinitis, unspecified: Secondary | ICD-10-CM

## 2023-11-10 ENCOUNTER — Ambulatory Visit (INDEPENDENT_AMBULATORY_CARE_PROVIDER_SITE_OTHER)

## 2023-11-10 DIAGNOSIS — J309 Allergic rhinitis, unspecified: Secondary | ICD-10-CM | POA: Diagnosis not present

## 2023-11-18 ENCOUNTER — Encounter: Payer: Self-pay | Admitting: Family Medicine

## 2023-11-18 ENCOUNTER — Ambulatory Visit (INDEPENDENT_AMBULATORY_CARE_PROVIDER_SITE_OTHER): Admitting: Family Medicine

## 2023-11-18 VITALS — BP 124/84 | HR 79 | Temp 97.0°F | Ht 63.0 in | Wt 137.0 lb

## 2023-11-18 DIAGNOSIS — Z1159 Encounter for screening for other viral diseases: Secondary | ICD-10-CM

## 2023-11-18 DIAGNOSIS — F458 Other somatoform disorders: Secondary | ICD-10-CM

## 2023-11-18 DIAGNOSIS — Z0001 Encounter for general adult medical examination with abnormal findings: Secondary | ICD-10-CM | POA: Diagnosis not present

## 2023-11-18 DIAGNOSIS — Z0184 Encounter for antibody response examination: Secondary | ICD-10-CM

## 2023-11-18 DIAGNOSIS — N926 Irregular menstruation, unspecified: Secondary | ICD-10-CM

## 2023-11-18 DIAGNOSIS — Z136 Encounter for screening for cardiovascular disorders: Secondary | ICD-10-CM

## 2023-11-18 DIAGNOSIS — Z0000 Encounter for general adult medical examination without abnormal findings: Secondary | ICD-10-CM

## 2023-11-18 DIAGNOSIS — J301 Allergic rhinitis due to pollen: Secondary | ICD-10-CM

## 2023-11-18 DIAGNOSIS — Z130 Encounter for screening for diseases of the blood and blood-forming organs and certain disorders involving the immune mechanism: Secondary | ICD-10-CM

## 2023-11-18 NOTE — Progress Notes (Addendum)
 Complete physical exam  Patient: Elizabeth Briggs   DOB: 18-Aug-1985   37 y.o. Female  MRN: [id number]  Subjective:    Chief Complaint  Patient presents with   Annual Exam   Menstrual Problem    Elizabeth Briggs is a 38 y.o. female who presents today for a complete physical exam. She reports consuming a general diet. The patient has a physically strenuous job, but has no regular exercise apart from work.  She generally feels well. She reports sleeping well. She does have additional problems to discuss today.   Elizabeth Briggs is a 38 year old female who presents for annual physical exam and with concerns about menstrual irregularities.  For the past two years, she has experienced spotting before the onset of her menstrual period. Her last menstrual cycle was prolonged at 36 days, and she took multiple pregnancy tests, all of which were negative. She also experienced an increased sense of smell during this time and reported that she felt like she was ovulating when she expected her period to start.  In the last six months, she has had difficulty sleeping, characterized by waking up at 1-3 AM and being unable to return to sleep. She experiences severe headaches before her period, described as almost migraine-like, which are somewhat alleviated by Excedrin. She also notes significant leg pain before her period, which subsides once menstruation begins.  She has a history of abnormal Pap smears approximately 25 years ago, which resolved after biopsies.  She is currently taking Zyrtec (cortisone hydrochloride) 20 mg when she receives her monthly allergy shots. She reports improvement in her ear symptoms since starting allergy shots and uses a night guard for teeth clenching, which has also improved her symptoms.  No significant weight changes, chest pain, or leg swelling. Eating, drinking, and bathroom habits are normal. No changes in hearing or vision.       Most recent fall risk assessment:     10/09/2022    3:29 PM  Fall Risk   Falls in the past year? 0     Most recent depression screenings:    11/18/2023   10:13 AM 10/09/2022    3:29 PM  PHQ 2/9 Scores  PHQ - 2 Score 0 0  PHQ- 9 Score 0 0    Vision:Within last year and Dental: No current dental problems  Patient Active Problem List   Diagnosis Date Noted   Gastroesophageal reflux disease 09/10/2021   Dysfunction of both eustachian tubes 03/11/2019   Seasonal and perennial allergic rhinitis 10/12/2015   Past Medical History:  Diagnosis Date   Abnormal Pap smear of cervix 2004   Allergies    Eczema    Past Surgical History:  Procedure Laterality Date   COLPOSCOPY     MOUTH SURGERY     TYMPANOSTOMY TUBE PLACEMENT     Social History   Tobacco Use   Smoking status: Former    Current packs/day: 0.00    Types: Cigarettes    Start date: 2003    Quit date: 2016    Years since quitting: 9.8   Smokeless tobacco: Never  Vaping Use   Vaping status: Never Used  Substance Use Topics   Alcohol use: No   Drug use: No   Social History   Socioeconomic History   Marital status: Married    Spouse name: Not on file   Number of children: 1   Years of education: Not on file   Highest education level: Not  on file  Occupational History   Not on file  Tobacco Use   Smoking status: Former    Current packs/day: 0.00    Types: Cigarettes    Start date: 2003    Quit date: 2016    Years since quitting: 9.8   Smokeless tobacco: Never  Vaping Use   Vaping status: Never Used  Substance and Sexual Activity   Alcohol use: No   Drug use: No   Sexual activity: Yes    Partners: Male    Birth control/protection: None  Other Topics Concern   Not on file  Social History Narrative   Not on file   Social Drivers of Health   Financial Resource Strain: Not on file  Food Insecurity: Low Risk  (08/12/2022)   Received from [facility]   Hunger Vital Sign    Within the past 12 months, you worried that your food would  run out before you got money to buy more: Never true    Within the past 12 months, the food you bought just didn't last and you didn't have money to get more. : Never true  Transportation Needs: Not on file (08/12/2022)  Physical Activity: Not on file  Stress: Not on file  Social Connections: Unknown (05/30/2021)   Received from [facility]   Social Network    Social Network: Not on file  Intimate Partner Violence: Unknown (05/01/2021)   Received from [facility]   HITS    Physically Hurt: Not on file    Insult or Talk Down To: Not on file    Threaten Physical Harm: Not on file    Scream or Curse: Not on file   Family Status  Relation Name Status   Mother  Alive   Father  Alive   Sister  Alive   Brother  Alive   Daughter  Alive   MGM  Alive   MGF  Deceased   PGM  Deceased  No partnership data on file   Family History  Problem Relation Age of Onset   Hyperlipidemia Mother    Endometriosis Mother    Allergic rhinitis Mother    Prostate cancer Father 80   Eczema Father    Diabetes type I Brother    Diabetes Maternal Grandmother    Anuerysm Paternal Grandmother    No Known Allergies    Patient Care Team: Catherine Cubero, Rock HERO, FNP as PCP - General (Family Medicine)   Outpatient Medications Prior to Visit  Medication Sig   Cetirizine HCl 10 MG CAPS Take 1 capsule by mouth daily.   EPINEPHrine  (AUVI-Q ) 0.3 mg/0.3 mL IJ SOAJ injection Inject 0.3 mg into the muscle as needed for anaphylaxis.   omeprazole  (PRILOSEC) 20 MG capsule Take 20 mg by mouth daily.   [DISCONTINUED] loratadine (CLARITIN) 10 MG tablet Take 10 mg by mouth daily.   [DISCONTINUED] fluticasone  (FLONASE ) 50 MCG/ACT nasal spray Place 1 spray into both nostrils daily.   No facility-administered medications prior to visit.    ROS per HPI      Objective:     BP 124/84   Pulse 79   Temp (!) 97 F (36.1 C)   Ht 5' 3 (1.6 m)   Wt 137 lb (62.1 kg)   LMP 11/09/2023   SpO2 100%   BMI 24.27 kg/m  BP  Readings from Last 3 Encounters:  11/18/23 124/84  12/16/22 110/66  11/25/22 121/80   Wt Readings from Last 3 Encounters:  11/18/23 137 lb (62.1  kg)  12/16/22 130 lb 8 oz (59.2 kg)  11/25/22 123 lb (55.8 kg)   SpO2 Readings from Last 3 Encounters:  11/18/23 100%  12/16/22 98%  10/09/22 99%      Physical Exam Vitals and nursing note reviewed.  Constitutional:      General: She is not in acute distress.    Appearance: Normal appearance. She is well-developed, well-groomed and normal weight. She is not ill-appearing, toxic-appearing or diaphoretic.  HENT:     Head: Normocephalic and atraumatic.     Jaw: There is normal jaw occlusion.     Right Ear: Hearing, tympanic membrane, ear canal and external ear normal.     Left Ear: Hearing, tympanic membrane, ear canal and external ear normal.     Nose: Nose normal.     Mouth/Throat:     Lips: Pink.     Mouth: Mucous membranes are moist.     Pharynx: Oropharynx is clear. Uvula midline.  Eyes:     General: Lids are normal.     Extraocular Movements: Extraocular movements intact.     Conjunctiva/sclera: Conjunctivae normal.     Pupils: Pupils are equal, round, and reactive to light.  Neck:     Thyroid : No thyroid  mass, thyromegaly or thyroid  tenderness.     Vascular: No carotid bruit or JVD.     Trachea: Trachea and phonation normal.  Cardiovascular:     Rate and Rhythm: Normal rate and regular rhythm.     Chest Wall: PMI is not displaced.     Pulses: Normal pulses.     Heart sounds: Normal heart sounds. No murmur heard.    No friction rub. No gallop.  Pulmonary:     Effort: Pulmonary effort is normal. No respiratory distress.     Breath sounds: Normal breath sounds. No wheezing.  Abdominal:     General: Bowel sounds are normal. There is no distension or abdominal bruit.     Palpations: Abdomen is soft. There is no hepatomegaly or splenomegaly.     Tenderness: There is no abdominal tenderness. There is no right CVA tenderness  or left CVA tenderness.     Hernia: No hernia is present.  Musculoskeletal:        General: Normal range of motion.     Cervical back: Normal range of motion and neck supple.     Right lower leg: No edema.     Left lower leg: No edema.  Lymphadenopathy:     Cervical: No cervical adenopathy.  Skin:    General: Skin is warm and dry.     Capillary Refill: Capillary refill takes less than 2 seconds.     Coloration: Skin is not cyanotic, jaundiced or pale.     Findings: No rash.  Neurological:     General: No focal deficit present.     Mental Status: She is alert and oriented to person, place, and time.     Sensory: Sensation is intact.     Motor: Motor function is intact.     Coordination: Coordination is intact.     Gait: Gait is intact.     Deep Tendon Reflexes: Reflexes are normal and symmetric.  Psychiatric:        Attention and Perception: Attention and perception normal.        Mood and Affect: Mood and affect normal.        Speech: Speech normal.        Behavior: Behavior normal. Behavior is cooperative.  Thought Content: Thought content normal.        Cognition and Memory: Cognition and memory normal.        Judgment: Judgment normal.     Last CBC Lab Results  Component Value Date   WBC 4.1 11/18/2023   HGB 13.1 11/18/2023   HCT 41.5 11/18/2023   MCV 91 11/18/2023   MCH 28.7 11/18/2023   RDW 12.4 11/18/2023   PLT 249 11/18/2023   Last metabolic panel Lab Results  Component Value Date   GLUCOSE 95 11/18/2023   NA 139 11/18/2023   K 3.9 11/18/2023   CL 100 11/18/2023   CO2 23 11/18/2023   BUN 9 11/18/2023   CREATININE 0.76 11/18/2023   EGFR 102 11/18/2023   CALCIUM 9.7 11/18/2023   PROT 7.2 11/18/2023   ALBUMIN 5.0 (H) 11/18/2023   LABGLOB 2.2 11/18/2023   BILITOT 0.5 11/18/2023   ALKPHOS 53 11/18/2023   AST 20 11/18/2023   ALT 15 11/18/2023   Last lipids Lab Results  Component Value Date   CHOL 232 (H) 11/18/2023   HDL 79 11/18/2023    LDLCALC 139 (H) 11/18/2023   TRIG 83 11/18/2023   CHOLHDL 2.9 11/18/2023   Last hemoglobin A1c Lab Results  Component Value Date   HGBA1C 5.2 10/09/2022   Last thyroid  functions Lab Results  Component Value Date   TSH 1.260 10/09/2022   T4TOTAL 7.0 10/09/2022   Last vitamin D  Lab Results  Component Value Date   VD25OH 38.1 10/09/2022   Last vitamin B12 and Folate Lab Results  Component Value Date   VITAMINB12 642 11/18/2023   FOLATE >20.0 11/18/2023        Assessment & Plan:    Routine Health Maintenance and Physical Exam  Immunization History  Administered Date(s) Administered   Influenza,inj,Quad PF,6+ Mos 12/13/2014, 12/22/2017   Tdap 01/31/2015    Health Maintenance  Topic Date Due   COVID-19 Vaccine (1 - 2025-26 season) 12/04/2023 (Originally 09/28/2023)   Influenza Vaccine  04/26/2024 (Originally 08/28/2023)   Hepatitis B Vaccines 19-59 Average Risk (1 of 3 - 19+ 3-dose series) 11/17/2024 (Originally 05/11/2003)   HPV VACCINES (1 - 3-dose SCDM series) 11/17/2024 (Originally 05/11/2011)   DTaP/Tdap/Td (2 - Td or Tdap) 01/30/2025   Cervical Cancer Screening (HPV/Pap Cotest)  11/25/2027   Hepatitis C Screening  Completed   HIV Screening  Completed   Pneumococcal Vaccine  Aged Out   Meningococcal B Vaccine  Aged Out    Discussed health benefits of physical activity, and encouraged her to engage in regular exercise appropriate for her age and condition.  Problem List Items Addressed This Visit   None Visit Diagnoses       Annual physical exam    -  Primary   Relevant Orders   CMP14+EGFR (Completed)     Encounter for screening for cardiovascular disorders       Relevant Orders   CMP14+EGFR (Completed)   Lipid panel (Completed)   Anemia Profile B (Completed)     Screening for endocrine, nutritional, metabolic and immunity disorder       Relevant Orders   CMP14+EGFR (Completed)   Lipid panel (Completed)   Hormone Panel (Completed)   Anemia Profile B  (Completed)     Irregular menstrual cycle       Relevant Orders   Hormone Panel (Completed)   Anemia Profile B (Completed)     Need for hepatitis C screening test       Relevant Orders  Hepatitis C antibody (Completed)     Immunity status testing       Relevant Orders   Hepatitis B surface antibody,qualitative (Completed)     Bruxism         Seasonal allergic rhinitis due to pollen         Irregular menses             Menstrual irregularity with perimenopausal symptoms Menstrual irregularity with symptoms suggestive of perimenopause, including insomnia, headaches, leg cramps, heightened sense of smell, and spotting before periods. Last menstrual cycle was 36 days, with delayed ovulation suspected. Differential diagnosis includes thyroid  dysfunction or hormonal imbalance. - Order hormone profile including thyroid  function, estrogen, DHEA, testosterone, and progesterone - Order anemia profile including iron and B12 levels - Review results and determine further steps based on findings  Allergic rhinitis Allergic rhinitis managed with monthly allergy shots and Zyrtec 20 mg as needed with shots. Improvement in symptoms and ear condition. - Continue monthly allergy shots - Continue Zyrtec 20 mg as needed with allergy shots  Bruxism Bruxism managed with night guard. Improvement with night guard use and allergy treatment. Consideration of botulinum toxin injections into the masseter if symptoms persist. - Continue use of night guard - Consider referral for botulinum toxin injections into the masseter if symptoms do not improve  Adult Wellness Visit Routine adult wellness visit. No significant weight changes, chest pain, or leg swelling. Blood pressure is within normal limits. Regular dental visits every six months and annual eye exams, although the last eye exam was missed in September. - Perform blood count and cholesterol test - Encourage continuation of healthy diet changes,  including reducing fried foods and increasing intake of oatmeal, nuts, and chicken       Return in about 1 year (around 11/17/2024) for Annual Physical.     Rosaline Bruns, FNP

## 2023-11-19 ENCOUNTER — Ambulatory Visit: Payer: Self-pay | Admitting: Family Medicine

## 2023-11-30 ENCOUNTER — Telehealth: Payer: Self-pay | Admitting: Family Medicine

## 2023-11-30 NOTE — Telephone Encounter (Signed)
 Documented in result notes. LS

## 2023-11-30 NOTE — Telephone Encounter (Signed)
 Copied from CRM [id number]. Topic: Clinical - Lab/Test Results >> Nov 30, 2023 11:16 AM Sophia H wrote: Reason for CRM: Returning missed call about labs, advised per chart note TC and LDL elevated. Limit intake of fried, greasy, fatty, and fast foods. Diet and exercise to help lower these numbers.  Hormone profile is pending.  All other labs look good.   Advised still pending hormone panel.

## 2023-12-10 ENCOUNTER — Ambulatory Visit (INDEPENDENT_AMBULATORY_CARE_PROVIDER_SITE_OTHER)

## 2023-12-10 DIAGNOSIS — J309 Allergic rhinitis, unspecified: Secondary | ICD-10-CM | POA: Diagnosis not present

## 2023-12-17 ENCOUNTER — Other Ambulatory Visit: Payer: Self-pay

## 2023-12-17 ENCOUNTER — Ambulatory Visit: Payer: BC Managed Care – PPO | Admitting: Allergy

## 2023-12-17 ENCOUNTER — Encounter: Payer: Self-pay | Admitting: Allergy

## 2023-12-17 VITALS — BP 104/78 | HR 80 | Temp 98.3°F | Resp 16 | Ht 62.0 in | Wt 133.2 lb

## 2023-12-17 DIAGNOSIS — Z91038 Other insect allergy status: Secondary | ICD-10-CM | POA: Diagnosis not present

## 2023-12-17 DIAGNOSIS — J3089 Other allergic rhinitis: Secondary | ICD-10-CM

## 2023-12-17 DIAGNOSIS — J301 Allergic rhinitis due to pollen: Secondary | ICD-10-CM

## 2023-12-17 DIAGNOSIS — J3081 Allergic rhinitis due to animal (cat) (dog) hair and dander: Secondary | ICD-10-CM | POA: Diagnosis not present

## 2023-12-17 DIAGNOSIS — K219 Gastro-esophageal reflux disease without esophagitis: Secondary | ICD-10-CM

## 2023-12-17 MED ORDER — NEFFY 2 MG/0.1ML NA SOLN: 1.0000 | NASAL | 1 refills | Status: AC | PRN

## 2023-12-17 NOTE — Progress Notes (Signed)
 Follow Up Note  RE: Elizabeth Briggs: [id number] DOB: November 15, 1985 Date of Office Visit: 12/17/2023  Referring provider: Severa Rock HERO, FNP Primary care provider: Severa Rock HERO, FNP  Chief Complaint: Follow-up (Annual check up)  History of Present Illness: I had the pleasure of seeing Elizabeth Briggs for a follow up visit at the [facility] on 12/17/2023. She is a 38 y.o. female, who is being followed for allergic rhinitis on AIT, GERD. Her previous allergy office visit was on 12/16/2022 with Dr. Luke. Today is a regular follow up visit.  Discussed the use of AI scribe software for clinical note transcription with the patient, who gave verbal consent to proceed.    Her allergy shots are progressing well, with a reduction in localized itching and less significant swelling. She has decreased her allergy pill intake to one pill on injection days due to drowsiness, and she does not use hydrocortisone cream, nasal sprays, or eye drops. She notes an improvement in her seasonal allergies.  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 She uses a bite guard at night and is more aware of stress-related clenching, which has helped manage her symptoms.  She occasionally takes heartburn medication but not regularly. She experiences episodes of indigestion lasting a few days, often related to constipation or dietary triggers, but these resolve without ongoing medication use.  She has two cats at home, which she was previously allergic to, but her symptoms have improved.     Assessment and Plan: [name] is a 38 y.o. female with: Seasonal allergic rhinitis due to pollen Allergic rhinitis due to dust mite Allergic rhinitis due to animal dander Allergy to cockroaches Past history - 2021 skin testing positive to grass, ragweed, weed, trees, dust mites, cat. Started AIT on 08/02/2019 (G-[id number] & DM-C-CR) Interim history - Symptoms improved with allergy shots, no  significant flare-ups. Decreased localized reactions. Continue environmental control measures.  Continue allergy injections every 4 weeks.  Will plan on stopping injections after the next visit in 1 year.  Use over the counter antihistamines such as Zyrtec (cetirizine), Claritin (loratadine), Allegra (fexofenadine), or Xyzal (levocetirizine) daily as needed. May take twice a day during allergy flares. May switch antihistamines every few months. Use Flonase  (fluticasone ) nasal spray 1-2 sprays per nostril once a day as needed for nasal congestion.  Nasal saline spray (i.e., Simply Saline) or nasal saline lavage (i.e., NeilMed) is recommended as needed and prior to medicated nasal sprays. I have prescribed epinephrine  device (Neffy ) and demonstrated proper use. For mild symptoms you can take over the counter antihistamines such as zyrtec 10mg  to 20mg  and monitor symptoms closely. If symptoms worsen or if you have severe symptoms including breathing issues, throat closure, significant swelling, whole body hives, severe diarrhea and vomiting, lightheadedness then spray Neffy  in the nose and seek immediate medical care afterwards. Do not use any nasal sprays for 2 weeks afterwards.  If Neffy  is not covered let me know.  Emergency action plan provided.    Gastroesophageal reflux disease, unspecified whether esophagitis present Intermittent indigestion possibly related to diet or constipation. Symptoms not persistent. Continue lifestyle and dietary modifications. May use Prilosec 20mg  daily a day as needed.    Return in about 1 year (around 12/16/2024).  Meds ordered this encounter  Medications   EPINEPHrine  (NEFFY ) 2 MG/0.1ML SOLN    Sig: Place 1 Dose into the nose as needed (anaphylactic reaction).    Dispense:  2 each  Refill:  1   Lab Orders  No laboratory test(s) ordered today    Diagnostics: None.   Medication List:  Current Outpatient Medications  Medication Sig Dispense Refill    Cetirizine HCl 10 MG CAPS Take 1 capsule by mouth daily.     EPINEPHrine  (NEFFY ) 2 MG/0.1ML SOLN Place 1 Dose into the nose as needed (anaphylactic reaction). 2 each 1   famotidine (PEPCID) 20 MG tablet Take 20 mg by mouth once as needed for heartburn or indigestion.     omeprazole  (PRILOSEC) 20 MG capsule Take 20 mg by mouth daily.     No current facility-administered medications for this visit.   Allergies: No Known Allergies I reviewed her past medical history, social history, family history, and environmental history and no significant changes have been reported from her previous visit.  Review of Systems  Constitutional:  Negative for appetite change, chills, fever and unexpected weight change.  HENT:  Negative for congestion, postnasal drip and sneezing.   Eyes:  Negative for itching.  Respiratory:  Negative for cough, chest tightness, shortness of breath and wheezing.   Cardiovascular:  Negative for chest pain.  Gastrointestinal:  Negative for abdominal pain.  Genitourinary:  Negative for difficulty urinating.  Skin:  Negative for rash.  Allergic/Immunologic: Positive for environmental allergies.    Objective: BP 104/78 (BP Location: Left Arm, Patient Position: Sitting, Cuff Size: Normal)   Pulse 80   Temp 98.3 F (36.8 C) (Temporal)   Resp 16   Ht 5' 2 (1.575 m)   Wt 133 lb 4 oz (60.4 kg)   LMP 11/09/2023   SpO2 97%   BMI 24.37 kg/m  Body mass index is 24.37 kg/m. Physical Exam Vitals and nursing note reviewed.  Constitutional:      Appearance: Normal appearance. She is well-developed.  HENT:     Head: Normocephalic and atraumatic.     Right Ear: Tympanic membrane and external ear normal.     Left Ear: Tympanic membrane and external ear normal.     Nose: Nose normal.     Mouth/Throat:     Mouth: Mucous membranes are moist.     Pharynx: Oropharynx is clear.  Eyes:     Conjunctiva/sclera: Conjunctivae normal.  Cardiovascular:     Rate and Rhythm: Normal  rate and regular rhythm.     Heart sounds: Normal heart sounds. No murmur heard.    No friction rub. No gallop.  Pulmonary:     Effort: Pulmonary effort is normal.     Breath sounds: Normal breath sounds. No wheezing, rhonchi or rales.  Musculoskeletal:     Cervical back: Neck supple.  Skin:    General: Skin is warm.     Findings: No rash.  Neurological:     Mental Status: She is alert and oriented to person, place, and time.  Psychiatric:        Mood and Affect: Mood normal.        Behavior: Behavior normal.    Previous notes and tests were reviewed. The plan was reviewed with the patient/family, and all questions/concerned were addressed.  It was my pleasure to see Heidy today and participate in her care. Please feel free to contact me with any questions or concerns.  Sincerely,  Orlan Cramp, DO Allergy & Immunology  [facility] office: [phone] [facility] office: [phone]

## 2023-12-17 NOTE — Patient Instructions (Addendum)
 Environmental allergies 2021 skin testing positive to grass, ragweed, weed, trees, dust mites, cat. Continue environmental control measures.  Continue allergy injections every 4 weeks.  Will plan on stopping injections after the next visit in 1 year.  Use over the counter antihistamines such as Zyrtec (cetirizine), Claritin (loratadine), Allegra (fexofenadine), or Xyzal (levocetirizine) daily as needed. May take twice a day during allergy flares. May switch antihistamines every few months.  Use Flonase  (fluticasone ) nasal spray 1-2 sprays per nostril once a day as needed for nasal congestion.  Nasal saline spray (i.e., Simply Saline) or nasal saline lavage (i.e., NeilMed) is recommended as needed and prior to medicated nasal sprays.  I have prescribed epinephrine  device (Neffy ) and demonstrated proper use. For mild symptoms you can take over the counter antihistamines such as zyrtec 10mg  to 20mg  and monitor symptoms closely. If symptoms worsen or if you have severe symptoms including breathing issues, throat closure, significant swelling, whole body hives, severe diarrhea and vomiting, lightheadedness then spray Neffy  in the nose and seek immediate medical care afterwards. Do not use any nasal sprays for 2 weeks afterwards.  If Neffy  is not covered let me know.  Emergency action plan provided.   Reflux Continue lifestyle and dietary modifications. May use Prilosec 20mg  daily a day as needed.   Follow up in 12 months or sooner if needed.

## 2023-12-18 ENCOUNTER — Encounter: Payer: Self-pay | Admitting: Allergy

## 2023-12-19 LAB — LIPID PANEL
Chol/HDL Ratio: 2.9 ratio (ref 0.0–4.4)
Cholesterol, Total: 232 mg/dL — ABNORMAL HIGH (ref 100–199)
HDL: 79 mg/dL (ref >39)
LDL Chol Calc (NIH): 139 mg/dL — ABNORMAL HIGH (ref 0–99)
Triglycerides: 83 mg/dL (ref 0–149)
VLDL Cholesterol Cal: 14 mg/dL (ref 5–40)

## 2023-12-19 LAB — CMP14+EGFR
ALT: 15 IU/L (ref 0–32)
AST: 20 IU/L (ref 0–40)
Albumin: 5 g/dL — ABNORMAL HIGH (ref 3.9–4.9)
Alkaline Phosphatase: 53 IU/L (ref 41–116)
BUN/Creatinine Ratio: 12 (ref 9–23)
BUN: 9 mg/dL (ref 6–20)
Bilirubin Total: 0.5 mg/dL (ref 0.0–1.2)
CO2: 23 mmol/L (ref 20–29)
Calcium: 9.7 mg/dL (ref 8.7–10.2)
Chloride: 100 mmol/L (ref 96–106)
Creatinine, Ser: 0.76 mg/dL (ref 0.57–1.00)
Globulin, Total: 2.2 g/dL (ref 1.5–4.5)
Glucose: 95 mg/dL (ref 70–99)
Potassium: 3.9 mmol/L (ref 3.5–5.2)
Sodium: 139 mmol/L (ref 134–144)
Total Protein: 7.2 g/dL (ref 6.0–8.5)
eGFR: 102 mL/min/1.73 (ref >59)

## 2023-12-19 LAB — ANEMIA PROFILE B
Basophils Absolute: 0.1 x10E3/uL (ref 0.0–0.2)
Basos: 1 %
EOS (ABSOLUTE): 0.2 x10E3/uL (ref 0.0–0.4)
Eos: 4 %
Ferritin: 39 ng/mL (ref 15–150)
Folate: >20 ng/mL (ref >3.0)
Hematocrit: 41.5 % (ref 34.0–46.6)
Hemoglobin: 13.1 g/dL (ref 11.1–15.9)
Immature Grans (Abs): 0 x10E3/uL (ref 0.0–0.1)
Immature Granulocytes: 0 %
Iron Saturation: 30 % (ref 15–55)
Iron: 106 ug/dL (ref 27–159)
Lymphocytes Absolute: 1.4 x10E3/uL (ref 0.7–3.1)
Lymphs: 34 %
MCH: 28.7 pg (ref 26.6–33.0)
MCHC: 31.6 g/dL (ref 31.5–35.7)
MCV: 91 fL (ref 79–97)
Monocytes Absolute: 0.4 x10E3/uL (ref 0.1–0.9)
Monocytes: 9 %
Neutrophils Absolute: 2.1 x10E3/uL (ref 1.4–7.0)
Neutrophils: 52 %
Platelets: 249 x10E3/uL (ref 150–450)
RBC: 4.56 x10E6/uL (ref 3.77–5.28)
RDW: 12.4 % (ref 11.7–15.4)
Retic Ct Pct: 1.2 % (ref 0.6–2.6)
Total Iron Binding Capacity: 352 ug/dL (ref 250–450)
UIBC: 246 ug/dL (ref 131–425)
Vitamin B-12: 642 pg/mL (ref 232–1245)
WBC: 4.1 x10E3/uL (ref 3.4–10.8)

## 2023-12-19 LAB — HORMONE PANEL (T4,TSH,FSH,TESTT,SHBG,DHEA,ETC)
DHEA-Sulfate, LCMS: 79 ug/dL
Estradiol, Serum, MS: 157 pg/mL
Estrone Sulfate: 170 ng/dL
Follicle Stimulating Hormone: 6.7 m[IU]/mL
Free T-3: 3.1 pg/mL
Free Testosterone, Serum: 6 pg/mL
Progesterone, Serum: <10 ng/dL
Sex Hormone Binding Globulin: 73.6 nmol/L
T4: 8 ug/dL
TSH: 1.5 uU/mL
Testosterone, Serum (Total): 67 ng/dL — ABNORMAL HIGH
Testosterone-% Free: 0.9 %
Triiodothyronine (T-3), Serum: 109 ng/dL

## 2023-12-19 LAB — HEPATITIS C ANTIBODY: Hep C Virus Ab: NONREACTIVE

## 2023-12-19 LAB — HEPATITIS B SURFACE ANTIBODY,QUALITATIVE: Hep B Surface Ab, Qual: REACTIVE

## 2023-12-21 MED ORDER — EPINEPHRINE 0.3 MG/0.3ML IJ SOAJ: 0.3000 mg | INTRAMUSCULAR | 1 refills | Status: AC | PRN

## 2024-01-07 ENCOUNTER — Ambulatory Visit (INDEPENDENT_AMBULATORY_CARE_PROVIDER_SITE_OTHER)

## 2024-01-07 DIAGNOSIS — J309 Allergic rhinitis, unspecified: Secondary | ICD-10-CM | POA: Diagnosis not present

## 2024-02-02 ENCOUNTER — Ambulatory Visit (INDEPENDENT_AMBULATORY_CARE_PROVIDER_SITE_OTHER)

## 2024-02-02 DIAGNOSIS — J302 Other seasonal allergic rhinitis: Secondary | ICD-10-CM

## 2024-02-26 NOTE — Progress Notes (Signed)
 VIALS MADE ON 02/26/24

## 2024-03-03 ENCOUNTER — Ambulatory Visit

## 2024-03-03 DIAGNOSIS — J302 Other seasonal allergic rhinitis: Secondary | ICD-10-CM

## 2024-11-18 ENCOUNTER — Encounter: Payer: Self-pay | Admitting: Family Medicine

## 2024-12-15 ENCOUNTER — Ambulatory Visit: Admitting: Allergy
